# Patient Record
Sex: Female | Born: 1950
Health system: Southern US, Community
[De-identification: ages and names within clinical notes are randomized; demographics above are authoritative.]

## PROBLEM LIST (undated history)

## (undated) DIAGNOSIS — K649 Unspecified hemorrhoids: Secondary | ICD-10-CM

## (undated) DIAGNOSIS — R519 Headache, unspecified: Secondary | ICD-10-CM

## (undated) DIAGNOSIS — E079 Disorder of thyroid, unspecified: Secondary | ICD-10-CM

## (undated) DIAGNOSIS — R51 Headache: Secondary | ICD-10-CM

## (undated) DIAGNOSIS — G8929 Other chronic pain: Secondary | ICD-10-CM

## (undated) HISTORY — DX: Other chronic pain: G89.29

## (undated) HISTORY — DX: Disorder of thyroid, unspecified: E07.9

## (undated) HISTORY — DX: Headache, unspecified: R51.9

## (undated) HISTORY — DX: Unspecified hemorrhoids: K64.9

## (undated) HISTORY — PX: THYROIDECTOMY, PARTIAL: SHX18

## (undated) HISTORY — DX: Headache: R51

---

## 1981-08-30 HISTORY — PX: APPENDECTOMY: SHX54

## 1981-08-30 HISTORY — PX: CHOLECYSTECTOMY: SHX55

## 1997-12-17 ENCOUNTER — Other Ambulatory Visit: Admission: RE | Admit: 1997-12-17 | Discharge: 1997-12-17 | Payer: Self-pay | Admitting: Obstetrics and Gynecology

## 1998-12-10 ENCOUNTER — Other Ambulatory Visit: Admission: RE | Admit: 1998-12-10 | Discharge: 1998-12-10 | Payer: Self-pay | Admitting: Obstetrics and Gynecology

## 1999-08-10 ENCOUNTER — Encounter: Admission: RE | Admit: 1999-08-10 | Discharge: 1999-08-10 | Payer: Self-pay | Admitting: Obstetrics and Gynecology

## 1999-08-10 ENCOUNTER — Encounter: Payer: Self-pay | Admitting: Obstetrics and Gynecology

## 1999-10-08 ENCOUNTER — Ambulatory Visit (HOSPITAL_COMMUNITY): Admission: RE | Admit: 1999-10-08 | Discharge: 1999-10-08 | Payer: Self-pay | Admitting: Gastroenterology

## 2000-01-11 ENCOUNTER — Other Ambulatory Visit: Admission: RE | Admit: 2000-01-11 | Discharge: 2000-01-11 | Payer: Self-pay | Admitting: Obstetrics and Gynecology

## 2000-07-29 ENCOUNTER — Other Ambulatory Visit: Admission: RE | Admit: 2000-07-29 | Discharge: 2000-07-29 | Payer: Self-pay | Admitting: Obstetrics and Gynecology

## 2001-02-09 ENCOUNTER — Other Ambulatory Visit: Admission: RE | Admit: 2001-02-09 | Discharge: 2001-02-09 | Payer: Self-pay | Admitting: Obstetrics and Gynecology

## 2001-08-16 ENCOUNTER — Ambulatory Visit (HOSPITAL_COMMUNITY): Admission: RE | Admit: 2001-08-16 | Discharge: 2001-08-16 | Payer: Self-pay | Admitting: Obstetrics and Gynecology

## 2002-03-12 ENCOUNTER — Other Ambulatory Visit: Admission: RE | Admit: 2002-03-12 | Discharge: 2002-03-12 | Payer: Self-pay | Admitting: Obstetrics and Gynecology

## 2003-04-11 ENCOUNTER — Other Ambulatory Visit: Admission: RE | Admit: 2003-04-11 | Discharge: 2003-04-11 | Payer: Self-pay | Admitting: Obstetrics and Gynecology

## 2004-04-30 ENCOUNTER — Other Ambulatory Visit: Admission: RE | Admit: 2004-04-30 | Discharge: 2004-04-30 | Payer: Self-pay | Admitting: Obstetrics and Gynecology

## 2005-08-12 ENCOUNTER — Other Ambulatory Visit: Admission: RE | Admit: 2005-08-12 | Discharge: 2005-08-12 | Payer: Self-pay | Admitting: Obstetrics and Gynecology

## 2015-04-21 DIAGNOSIS — Z131 Encounter for screening for diabetes mellitus: Secondary | ICD-10-CM | POA: Diagnosis not present

## 2015-04-21 DIAGNOSIS — E780 Pure hypercholesterolemia: Secondary | ICD-10-CM | POA: Diagnosis not present

## 2015-04-21 DIAGNOSIS — Z23 Encounter for immunization: Secondary | ICD-10-CM | POA: Diagnosis not present

## 2015-04-21 DIAGNOSIS — Z136 Encounter for screening for cardiovascular disorders: Secondary | ICD-10-CM | POA: Diagnosis not present

## 2015-04-21 DIAGNOSIS — Z0000 Encounter for general adult medical examination without abnormal findings: Secondary | ICD-10-CM | POA: Diagnosis not present

## 2015-05-06 ENCOUNTER — Encounter: Payer: Self-pay | Admitting: Gastroenterology

## 2015-05-13 ENCOUNTER — Other Ambulatory Visit: Payer: Self-pay | Admitting: Specialist

## 2015-05-13 DIAGNOSIS — R51 Headache: Principal | ICD-10-CM

## 2015-05-13 DIAGNOSIS — G8929 Other chronic pain: Secondary | ICD-10-CM

## 2015-05-24 ENCOUNTER — Ambulatory Visit
Admission: RE | Admit: 2015-05-24 | Discharge: 2015-05-24 | Disposition: A | Discharge status: Home / Self Care | Payer: Medicare Other | Source: Ambulatory Visit | Attending: Specialist | Admitting: Specialist

## 2015-05-24 DIAGNOSIS — R51 Headache: Principal | ICD-10-CM

## 2015-05-24 DIAGNOSIS — R519 Headache, unspecified: Secondary | ICD-10-CM

## 2015-06-24 ENCOUNTER — Ambulatory Visit (INDEPENDENT_AMBULATORY_CARE_PROVIDER_SITE_OTHER): Payer: Medicare Other | Admitting: Gastroenterology

## 2015-06-24 ENCOUNTER — Encounter: Payer: Self-pay | Admitting: Gastroenterology

## 2015-06-24 VITALS — BP 120/70 | HR 70 | Ht 65.5 in | Wt 132.1 lb

## 2015-06-24 DIAGNOSIS — K5900 Constipation, unspecified: Secondary | ICD-10-CM | POA: Diagnosis not present

## 2015-06-24 DIAGNOSIS — K625 Hemorrhage of anus and rectum: Secondary | ICD-10-CM | POA: Diagnosis not present

## 2015-06-24 DIAGNOSIS — K649 Unspecified hemorrhoids: Secondary | ICD-10-CM | POA: Diagnosis not present

## 2015-06-24 MED ORDER — HYDROCORTISONE ACETATE 25 MG RE SUPP: 25.0000 mg | Freq: Every day | RECTAL | Status: DC

## 2015-06-24 NOTE — Progress Notes (Signed)
HPI :  64 y/o female seen in consultation here for rectal bleeding and history of hemorrhoids.   Patient reports about 2 years ago she had rectal bleeding thought to be due to hemorrhoids, and underwent banding per Dr. Thana Farr. She had first two banding procedures which went fine without issues, and then after the 3rd banding she has had persistence of blood in the stools. She has waited for a few years but symptoms have essentially persisted over this time. She reports blood on the toilet paper only and wears pads to prevent bleeding on her clothes. She denies blood in the stools or in the toilet water. She notices her bleeding typically after 5 min following bowel movement, with 90% of her stools. She has some pain and irritation in the perianal area, roughly 40% of the time. She is having chronic constipation, has roughly 2 BMs per week. She was told to take miralax which she takes most days of the week but not daily. She denies significant straining with her stools. No abdominal pains.   She has had 2 prior colonoscopies, the last one was 10 years ago, no report on file. No polyps removed. Her father had colon cancer later in age, diagnosed around age 35s. No weight loss. She is eating okay without postprandial complaints.   She otherwise is healthy aside of PMH as below.   Past Medical History  Diagnosis Date  . Chronic headaches   . Hemorrhoids   . Thyroid disease      Past Surgical History  Procedure Laterality Date  . Appendectomy  1983  . Cholecystectomy  1983  . Thyroidectomy, partial     Family History  Problem Relation Age of Onset  . Colon cancer Father    Social History  Substance Use Topics  . Smoking status: Never Smoker   . Smokeless tobacco: None  . Alcohol Use: 4.2 - 8.4 oz/week    7-14 Standard drinks or equivalent per week   Current Outpatient Prescriptions  Medication Sig Dispense Refill  . gabapentin (NEURONTIN) 300 MG capsule Take 300 mg by mouth at  bedtime.    Marland Kitchen thyroid (ARMOUR) 120 MG tablet Take 120 mg by mouth daily before breakfast.    . hydrocortisone (ANUSOL-HC) 25 MG suppository Place 1 suppository (25 mg total) rectally daily. 20 suppository 1   No current facility-administered medications for this visit.   No Known Allergies   Review of Systems: All systems reviewed and negative except where noted in HPI.    No results found.  Physical Exam: BP 120/70 mmHg  Pulse 70  Ht 5' 5.5" (1.664 m)  Wt 132 lb 2 oz (59.932 kg)  BMI 21.64 kg/m2  LMP 06/24/2015 Constitutional: Pleasant,well-developed, female in no acute distress. HEENT: Normocephalic and atraumatic. Conjunctivae are normal. No scleral icterus. Neck supple.  Cardiovascular: Normal rate, regular rhythm.  Pulmonary/chest: Effort normal and breath sounds normal. No wheezing, rales or rhonchi. Abdominal: Soft, nondistended, nontender. Bowel sounds active throughout. There are no masses palpable. No hepatomegaly. Rectal exam: external hemorrhoid / skin tag, no anal fissure or mass noted on DRE Extremities: no edema Lymphadenopathy: No cervical adenopathy noted. Neurological: Alert and oriented to person place and time. Skin: Skin is warm and dry. No rashes noted. Psychiatric: Normal mood and affect. Behavior is normal.   ASSESSMENT AND PLAN: 64 y/o female with history of hemorrhoids s/p banding, last done 2 years ago, who has persistence of rectal bleeding over time. Father had colon cancer in age  1s, she has not had a colonoscopy in over 10 years, no reported history of polyps but no endoscopy report available today. DRE showed external hemorrhoid / skin tag, no fissure.   While I suspect she more than likely has hemorrhoids causing her symptoms, given her family history and last colonoscopy over 10 years ago, I am recommending a colonoscopy at this time to clarify the source of her bleeding and perform her CRC screening. She strongly did not wish to undergo a  colonoscopy, does not think she can tolerate the bowel prep and that her hemorrhoids will severely bother her with this. She was amenable to a flexible sigmoidoscopy to ensure no other process in the distal rectum and further evaluate internal hemorrhoids. I suspect she will likely need banding of hemorrhoids again or surgical consult pending the findings if this appears to be the culprit. Otherwise, recommend she take miralax daily and titrate up as needed to prevent constipation in the interim, and provided a trial of Anusol to use daily for 1-2 weeks to see if this helps.   She reports she is willing to proceed with a colonoscopy in the future once her acute issues are addressed, but was adamant that she did not want a colonoscopy at this time while understanding my recommendation for it. Will await flex sig results and her course following treatment of constipation and trial of Anusol.   Lauren Cellar, MD [facility] Pager [phone]

## 2015-06-24 NOTE — Patient Instructions (Signed)
You have been scheduled for a flexible sigmoidoscopy. Please follow the written instructions given to you at your visit today. If you use inhalers (even only as needed), please bring them with you on the day of your procedure.   We have sent medications to your pharmacy for you to pick up at your convenience.  Begin taking Miralax daily.

## 2015-08-19 DIAGNOSIS — Z1231 Encounter for screening mammogram for malignant neoplasm of breast: Secondary | ICD-10-CM | POA: Diagnosis not present

## 2015-09-03 DIAGNOSIS — K5900 Constipation, unspecified: Secondary | ICD-10-CM | POA: Diagnosis not present

## 2015-09-03 DIAGNOSIS — Z800 Family history of malignant neoplasm of digestive organs: Secondary | ICD-10-CM | POA: Diagnosis not present

## 2015-09-08 ENCOUNTER — Other Ambulatory Visit: Payer: Medicare Other | Admitting: Gastroenterology

## 2015-09-08 DIAGNOSIS — E039 Hypothyroidism, unspecified: Secondary | ICD-10-CM | POA: Diagnosis not present

## 2015-09-08 DIAGNOSIS — R7301 Impaired fasting glucose: Secondary | ICD-10-CM | POA: Diagnosis not present

## 2015-09-15 DIAGNOSIS — I10 Essential (primary) hypertension: Secondary | ICD-10-CM | POA: Diagnosis not present

## 2015-09-15 DIAGNOSIS — R7301 Impaired fasting glucose: Secondary | ICD-10-CM | POA: Diagnosis not present

## 2015-09-15 DIAGNOSIS — E039 Hypothyroidism, unspecified: Secondary | ICD-10-CM | POA: Diagnosis not present

## 2015-10-31 DIAGNOSIS — M25572 Pain in left ankle and joints of left foot: Secondary | ICD-10-CM | POA: Diagnosis not present

## 2015-10-31 DIAGNOSIS — M25571 Pain in right ankle and joints of right foot: Secondary | ICD-10-CM | POA: Diagnosis not present

## 2015-11-21 DIAGNOSIS — S82871A Displaced pilon fracture of right tibia, initial encounter for closed fracture: Secondary | ICD-10-CM | POA: Diagnosis not present

## 2015-12-19 DIAGNOSIS — S82871D Displaced pilon fracture of right tibia, subsequent encounter for closed fracture with routine healing: Secondary | ICD-10-CM | POA: Diagnosis not present

## 2016-01-08 DIAGNOSIS — M8588 Other specified disorders of bone density and structure, other site: Secondary | ICD-10-CM | POA: Diagnosis not present

## 2016-01-08 DIAGNOSIS — N958 Other specified menopausal and perimenopausal disorders: Secondary | ICD-10-CM | POA: Diagnosis not present

## 2016-01-13 DIAGNOSIS — S82871D Displaced pilon fracture of right tibia, subsequent encounter for closed fracture with routine healing: Secondary | ICD-10-CM | POA: Diagnosis not present

## 2016-06-10 DIAGNOSIS — K642 Third degree hemorrhoids: Secondary | ICD-10-CM | POA: Diagnosis not present

## 2016-06-10 DIAGNOSIS — K648 Other hemorrhoids: Secondary | ICD-10-CM | POA: Diagnosis not present

## 2016-06-10 DIAGNOSIS — K635 Polyp of colon: Secondary | ICD-10-CM | POA: Diagnosis not present

## 2016-06-10 DIAGNOSIS — D125 Benign neoplasm of sigmoid colon: Secondary | ICD-10-CM | POA: Diagnosis not present

## 2016-06-10 DIAGNOSIS — D122 Benign neoplasm of ascending colon: Secondary | ICD-10-CM | POA: Diagnosis not present

## 2016-06-10 DIAGNOSIS — D126 Benign neoplasm of colon, unspecified: Secondary | ICD-10-CM | POA: Diagnosis not present

## 2016-06-10 DIAGNOSIS — Z86010 Personal history of colonic polyps: Secondary | ICD-10-CM | POA: Diagnosis not present

## 2016-06-10 DIAGNOSIS — Z1211 Encounter for screening for malignant neoplasm of colon: Secondary | ICD-10-CM | POA: Diagnosis not present

## 2016-06-10 DIAGNOSIS — Z800 Family history of malignant neoplasm of digestive organs: Secondary | ICD-10-CM | POA: Diagnosis not present

## 2016-06-10 DIAGNOSIS — D123 Benign neoplasm of transverse colon: Secondary | ICD-10-CM | POA: Diagnosis not present

## 2016-06-10 DIAGNOSIS — D127 Benign neoplasm of rectosigmoid junction: Secondary | ICD-10-CM | POA: Diagnosis not present

## 2016-06-15 DIAGNOSIS — Z23 Encounter for immunization: Secondary | ICD-10-CM | POA: Diagnosis not present

## 2016-07-07 DIAGNOSIS — K641 Second degree hemorrhoids: Secondary | ICD-10-CM | POA: Diagnosis not present

## 2016-07-21 DIAGNOSIS — K641 Second degree hemorrhoids: Secondary | ICD-10-CM | POA: Diagnosis not present

## 2016-08-12 DIAGNOSIS — E039 Hypothyroidism, unspecified: Secondary | ICD-10-CM | POA: Diagnosis not present

## 2016-08-12 DIAGNOSIS — Z0000 Encounter for general adult medical examination without abnormal findings: Secondary | ICD-10-CM | POA: Diagnosis not present

## 2016-08-12 DIAGNOSIS — Z131 Encounter for screening for diabetes mellitus: Secondary | ICD-10-CM | POA: Diagnosis not present

## 2016-09-14 DIAGNOSIS — Z1231 Encounter for screening mammogram for malignant neoplasm of breast: Secondary | ICD-10-CM | POA: Diagnosis not present

## 2016-09-14 DIAGNOSIS — Z01419 Encounter for gynecological examination (general) (routine) without abnormal findings: Secondary | ICD-10-CM | POA: Diagnosis not present

## 2016-09-21 DIAGNOSIS — E039 Hypothyroidism, unspecified: Secondary | ICD-10-CM | POA: Diagnosis not present

## 2016-09-21 DIAGNOSIS — R7301 Impaired fasting glucose: Secondary | ICD-10-CM | POA: Diagnosis not present

## 2016-09-21 DIAGNOSIS — Z131 Encounter for screening for diabetes mellitus: Secondary | ICD-10-CM | POA: Diagnosis not present

## 2016-09-23 DIAGNOSIS — R7301 Impaired fasting glucose: Secondary | ICD-10-CM | POA: Diagnosis not present

## 2016-09-23 DIAGNOSIS — I10 Essential (primary) hypertension: Secondary | ICD-10-CM | POA: Diagnosis not present

## 2016-09-23 DIAGNOSIS — E039 Hypothyroidism, unspecified: Secondary | ICD-10-CM | POA: Diagnosis not present

## 2016-11-18 DIAGNOSIS — B999 Unspecified infectious disease: Secondary | ICD-10-CM | POA: Diagnosis not present

## 2016-11-22 DIAGNOSIS — E039 Hypothyroidism, unspecified: Secondary | ICD-10-CM | POA: Diagnosis not present

## 2017-01-10 DIAGNOSIS — H903 Sensorineural hearing loss, bilateral: Secondary | ICD-10-CM | POA: Diagnosis not present

## 2017-01-10 DIAGNOSIS — H9313 Tinnitus, bilateral: Secondary | ICD-10-CM | POA: Diagnosis not present

## 2017-01-12 ENCOUNTER — Other Ambulatory Visit: Payer: Self-pay | Admitting: Otolaryngology

## 2017-01-12 DIAGNOSIS — H9313 Tinnitus, bilateral: Secondary | ICD-10-CM

## 2017-01-12 DIAGNOSIS — H903 Sensorineural hearing loss, bilateral: Secondary | ICD-10-CM

## 2017-01-21 ENCOUNTER — Other Ambulatory Visit: Payer: Medicare Other

## 2017-01-25 ENCOUNTER — Ambulatory Visit
Admission: RE | Admit: 2017-01-25 | Discharge: 2017-01-25 | Disposition: A | Discharge status: Home / Self Care | Payer: Medicare Other | Source: Ambulatory Visit | Attending: Otolaryngology | Admitting: Otolaryngology

## 2017-01-25 DIAGNOSIS — H9313 Tinnitus, bilateral: Secondary | ICD-10-CM

## 2017-01-25 DIAGNOSIS — H93A2 Pulsatile tinnitus, left ear: Secondary | ICD-10-CM | POA: Diagnosis not present

## 2017-01-25 DIAGNOSIS — H903 Sensorineural hearing loss, bilateral: Secondary | ICD-10-CM

## 2017-01-25 MED ORDER — GADOBENATE DIMEGLUMINE 529 MG/ML IV SOLN
12.0000 mL | Freq: Once | INTRAVENOUS | Status: AC | PRN
  Administered 2017-01-25: 12 mL via INTRAVENOUS

## 2017-02-15 ENCOUNTER — Telehealth (HOSPITAL_COMMUNITY): Payer: Self-pay

## 2017-02-15 NOTE — Telephone Encounter (Signed)
Called to schedule consult, left message for pt to return call. AW 

## 2017-02-25 ENCOUNTER — Telehealth (HOSPITAL_COMMUNITY): Payer: Self-pay

## 2017-02-25 NOTE — Telephone Encounter (Signed)
Left message for pt to return call. AW 

## 2017-02-28 ENCOUNTER — Other Ambulatory Visit (HOSPITAL_COMMUNITY): Payer: Self-pay | Admitting: Interventional Radiology

## 2017-02-28 DIAGNOSIS — H9312 Tinnitus, left ear: Secondary | ICD-10-CM

## 2017-03-14 ENCOUNTER — Ambulatory Visit (HOSPITAL_COMMUNITY): Admission: RE | Admit: 2017-03-14 | Payer: Medicare Other | Source: Ambulatory Visit

## 2017-03-15 ENCOUNTER — Ambulatory Visit (HOSPITAL_COMMUNITY)
Admission: RE | Admit: 2017-03-15 | Discharge: 2017-03-15 | Disposition: A | Discharge status: Home / Self Care | Payer: Medicare Other | Source: Ambulatory Visit | Attending: Interventional Radiology | Admitting: Interventional Radiology

## 2017-03-15 DIAGNOSIS — H9312 Tinnitus, left ear: Secondary | ICD-10-CM | POA: Diagnosis not present

## 2017-03-15 HISTORY — PX: IR RADIOLOGIST EVAL & MGMT: IMG5224

## 2017-03-16 ENCOUNTER — Encounter (HOSPITAL_COMMUNITY): Payer: Self-pay | Admitting: Interventional Radiology

## 2017-03-24 ENCOUNTER — Other Ambulatory Visit: Payer: Self-pay | Admitting: Otolaryngology

## 2017-03-24 DIAGNOSIS — H524 Presbyopia: Secondary | ICD-10-CM | POA: Diagnosis not present

## 2017-03-24 DIAGNOSIS — H2513 Age-related nuclear cataract, bilateral: Secondary | ICD-10-CM | POA: Diagnosis not present

## 2017-03-24 DIAGNOSIS — H9313 Tinnitus, bilateral: Secondary | ICD-10-CM

## 2017-03-24 DIAGNOSIS — H5203 Hypermetropia, bilateral: Secondary | ICD-10-CM | POA: Diagnosis not present

## 2017-03-28 ENCOUNTER — Other Ambulatory Visit: Payer: Medicare Other

## 2017-03-29 ENCOUNTER — Ambulatory Visit
Admission: RE | Admit: 2017-03-29 | Discharge: 2017-03-29 | Disposition: A | Discharge status: Home / Self Care | Payer: Medicare Other | Source: Ambulatory Visit | Attending: Otolaryngology | Admitting: Otolaryngology

## 2017-03-29 DIAGNOSIS — I6521 Occlusion and stenosis of right carotid artery: Secondary | ICD-10-CM | POA: Diagnosis not present

## 2017-03-29 DIAGNOSIS — H9313 Tinnitus, bilateral: Secondary | ICD-10-CM

## 2017-05-09 DIAGNOSIS — Z23 Encounter for immunization: Secondary | ICD-10-CM | POA: Diagnosis not present

## 2017-05-19 DIAGNOSIS — R05 Cough: Secondary | ICD-10-CM | POA: Diagnosis not present

## 2017-05-19 DIAGNOSIS — R0982 Postnasal drip: Secondary | ICD-10-CM | POA: Diagnosis not present

## 2017-08-10 DIAGNOSIS — G4700 Insomnia, unspecified: Secondary | ICD-10-CM | POA: Diagnosis not present

## 2017-08-10 DIAGNOSIS — E7800 Pure hypercholesterolemia, unspecified: Secondary | ICD-10-CM | POA: Diagnosis not present

## 2017-09-15 DIAGNOSIS — Z124 Encounter for screening for malignant neoplasm of cervix: Secondary | ICD-10-CM | POA: Diagnosis not present

## 2017-09-15 DIAGNOSIS — Z6821 Body mass index (BMI) 21.0-21.9, adult: Secondary | ICD-10-CM | POA: Diagnosis not present

## 2017-09-20 DIAGNOSIS — R7301 Impaired fasting glucose: Secondary | ICD-10-CM | POA: Diagnosis not present

## 2017-09-20 DIAGNOSIS — E039 Hypothyroidism, unspecified: Secondary | ICD-10-CM | POA: Diagnosis not present

## 2017-09-26 DIAGNOSIS — E039 Hypothyroidism, unspecified: Secondary | ICD-10-CM | POA: Diagnosis not present

## 2017-09-26 DIAGNOSIS — I10 Essential (primary) hypertension: Secondary | ICD-10-CM | POA: Diagnosis not present

## 2017-09-26 DIAGNOSIS — R7301 Impaired fasting glucose: Secondary | ICD-10-CM | POA: Diagnosis not present

## 2017-10-20 DIAGNOSIS — D485 Neoplasm of uncertain behavior of skin: Secondary | ICD-10-CM | POA: Diagnosis not present

## 2017-10-20 DIAGNOSIS — D2272 Melanocytic nevi of left lower limb, including hip: Secondary | ICD-10-CM | POA: Diagnosis not present

## 2017-10-20 DIAGNOSIS — D225 Melanocytic nevi of trunk: Secondary | ICD-10-CM | POA: Diagnosis not present

## 2017-10-20 DIAGNOSIS — D2262 Melanocytic nevi of left upper limb, including shoulder: Secondary | ICD-10-CM | POA: Diagnosis not present

## 2017-10-20 DIAGNOSIS — D2261 Melanocytic nevi of right upper limb, including shoulder: Secondary | ICD-10-CM | POA: Diagnosis not present

## 2017-10-20 DIAGNOSIS — D1801 Hemangioma of skin and subcutaneous tissue: Secondary | ICD-10-CM | POA: Diagnosis not present

## 2017-10-20 DIAGNOSIS — D2372 Other benign neoplasm of skin of left lower limb, including hip: Secondary | ICD-10-CM | POA: Diagnosis not present

## 2017-10-20 DIAGNOSIS — D2371 Other benign neoplasm of skin of right lower limb, including hip: Secondary | ICD-10-CM | POA: Diagnosis not present

## 2017-10-20 DIAGNOSIS — L920 Granuloma annulare: Secondary | ICD-10-CM | POA: Diagnosis not present

## 2017-10-20 DIAGNOSIS — D2271 Melanocytic nevi of right lower limb, including hip: Secondary | ICD-10-CM | POA: Diagnosis not present

## 2017-10-20 DIAGNOSIS — L814 Other melanin hyperpigmentation: Secondary | ICD-10-CM | POA: Diagnosis not present

## 2017-10-20 DIAGNOSIS — L821 Other seborrheic keratosis: Secondary | ICD-10-CM | POA: Diagnosis not present

## 2017-11-14 DIAGNOSIS — Z0000 Encounter for general adult medical examination without abnormal findings: Secondary | ICD-10-CM | POA: Diagnosis not present

## 2017-11-14 DIAGNOSIS — Z23 Encounter for immunization: Secondary | ICD-10-CM | POA: Diagnosis not present

## 2017-11-14 DIAGNOSIS — E039 Hypothyroidism, unspecified: Secondary | ICD-10-CM | POA: Diagnosis not present

## 2017-11-14 DIAGNOSIS — E7800 Pure hypercholesterolemia, unspecified: Secondary | ICD-10-CM | POA: Diagnosis not present

## 2017-11-17 DIAGNOSIS — E7800 Pure hypercholesterolemia, unspecified: Secondary | ICD-10-CM | POA: Diagnosis not present

## 2017-11-17 DIAGNOSIS — Z0000 Encounter for general adult medical examination without abnormal findings: Secondary | ICD-10-CM | POA: Diagnosis not present

## 2017-11-17 DIAGNOSIS — G4700 Insomnia, unspecified: Secondary | ICD-10-CM | POA: Diagnosis not present

## 2017-11-17 DIAGNOSIS — Z1159 Encounter for screening for other viral diseases: Secondary | ICD-10-CM | POA: Diagnosis not present

## 2018-01-09 DIAGNOSIS — Z1231 Encounter for screening mammogram for malignant neoplasm of breast: Secondary | ICD-10-CM | POA: Diagnosis not present

## 2018-01-09 DIAGNOSIS — M8588 Other specified disorders of bone density and structure, other site: Secondary | ICD-10-CM | POA: Diagnosis not present

## 2018-01-09 DIAGNOSIS — N958 Other specified menopausal and perimenopausal disorders: Secondary | ICD-10-CM | POA: Diagnosis not present

## 2018-01-10 ENCOUNTER — Other Ambulatory Visit: Payer: Self-pay | Admitting: Family Medicine

## 2018-01-10 ENCOUNTER — Ambulatory Visit
Admission: RE | Admit: 2018-01-10 | Discharge: 2018-01-10 | Disposition: A | Discharge status: Home / Self Care | Payer: Medicare Other | Source: Ambulatory Visit | Attending: Family Medicine | Admitting: Family Medicine

## 2018-01-10 DIAGNOSIS — R42 Dizziness and giddiness: Secondary | ICD-10-CM | POA: Diagnosis not present

## 2018-01-10 DIAGNOSIS — R51 Headache: Secondary | ICD-10-CM | POA: Diagnosis not present

## 2018-01-10 DIAGNOSIS — H9313 Tinnitus, bilateral: Secondary | ICD-10-CM

## 2018-01-10 DIAGNOSIS — R531 Weakness: Secondary | ICD-10-CM | POA: Diagnosis not present

## 2018-01-12 DIAGNOSIS — K120 Recurrent oral aphthae: Secondary | ICD-10-CM | POA: Diagnosis not present

## 2018-05-31 DIAGNOSIS — Z23 Encounter for immunization: Secondary | ICD-10-CM | POA: Diagnosis not present

## 2018-06-14 DIAGNOSIS — R635 Abnormal weight gain: Secondary | ICD-10-CM | POA: Diagnosis not present

## 2018-06-14 DIAGNOSIS — N951 Menopausal and female climacteric states: Secondary | ICD-10-CM | POA: Diagnosis not present

## 2018-06-19 DIAGNOSIS — F329 Major depressive disorder, single episode, unspecified: Secondary | ICD-10-CM | POA: Diagnosis not present

## 2018-06-19 DIAGNOSIS — N951 Menopausal and female climacteric states: Secondary | ICD-10-CM | POA: Diagnosis not present

## 2018-06-19 DIAGNOSIS — M26629 Arthralgia of temporomandibular joint, unspecified side: Secondary | ICD-10-CM | POA: Diagnosis not present

## 2018-06-19 DIAGNOSIS — R6882 Decreased libido: Secondary | ICD-10-CM | POA: Diagnosis not present

## 2018-06-19 DIAGNOSIS — G479 Sleep disorder, unspecified: Secondary | ICD-10-CM | POA: Diagnosis not present

## 2018-06-19 DIAGNOSIS — E039 Hypothyroidism, unspecified: Secondary | ICD-10-CM | POA: Diagnosis not present

## 2018-07-06 IMAGING — MR MR MRA HEAD W/O CM
13 of 15 series · 36 of 48 positions shown · IV contrast (multihance)
Comparison: 05/24/2015

CLINICAL DATA: Chronic left-sided pulsatile tinnitus. Slight
worsening recently.

Creatinine was obtained on site at [HOSPITAL] at [HOSPITAL].Results: Creatinine 0.8 mg/dL.
EXAM:
MRI HEAD WITHOUT AND WITH CONTRAST
MRA HEAD WITHOUT CONTRAST
TECHNIQUE: Multiplanar, multiecho pulse sequences of the brain and surrounding
structures were obtained without and with intravenous contrast.
Angiographic images of the head were obtained using MRA technique
without contrast.
CONTRAST:  12mL MULTIHANCE GADOBENATE DIMEGLUMINE 529 MG/ML IV SOLN

[Series 2: T1 · sagittal · 5.0mm · 0.45mm/px · 1 of 19 slices shown (1 of 3)]
[im 1/19]
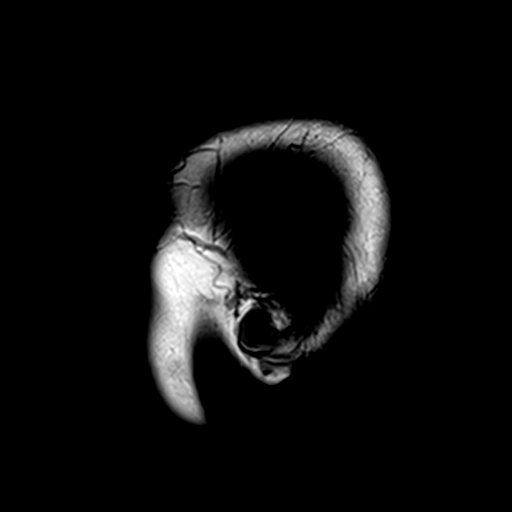

[Series 4: DWI · axial · 3.0mm · 1.80mm/px · z∈[-38,+108]mm · 5 of 99 slices shown (1 of 2)]
[im 1/99]
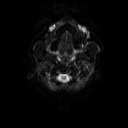
[im 25/99]
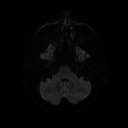
[im 50/99]
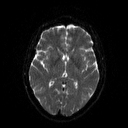
[im 74/99]
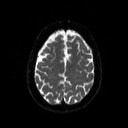
[im 99/99]
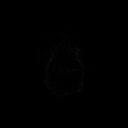

[Series 5: DWI · axial · 3.0mm · 1.80mm/px · z∈[-38,+108]mm · 3 of 50 slices shown (2 of 2)]
[im 1/50]
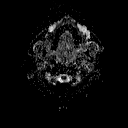
[im 25/50]
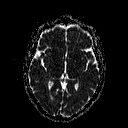
[im 50/50]
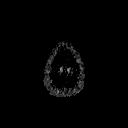

[Series 6: tof_3d_multi-slab · axial · 0.7mm · 0.35mm/px · z∈[-21,+62]mm · 8 of 126 slices shown]
[im 1/126]
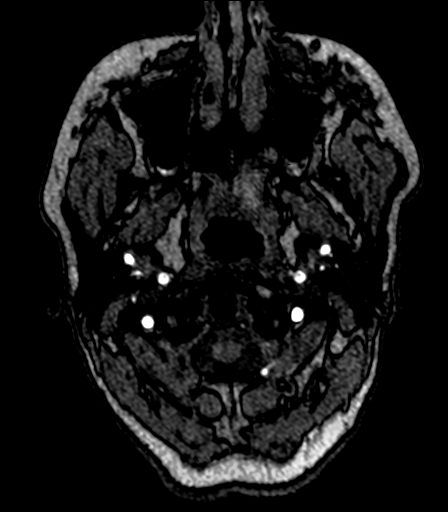
[im 18/126]
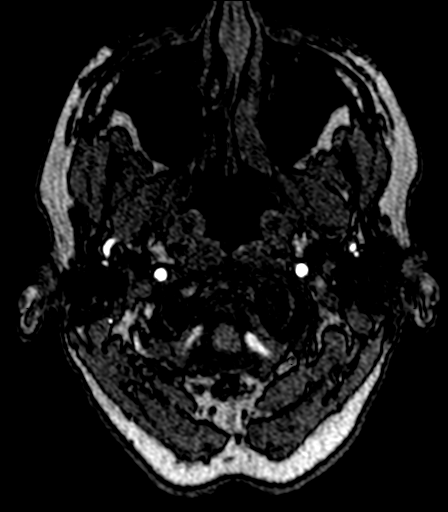
[im 36/126]
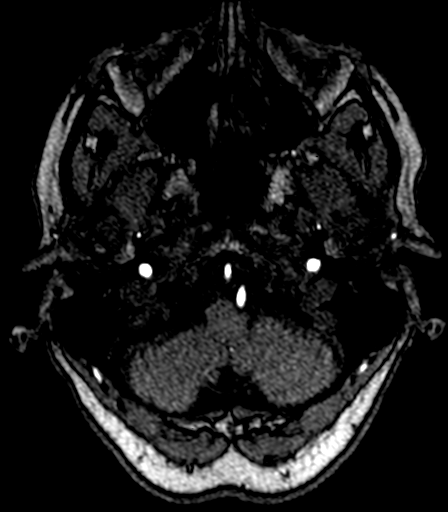
[im 54/126]
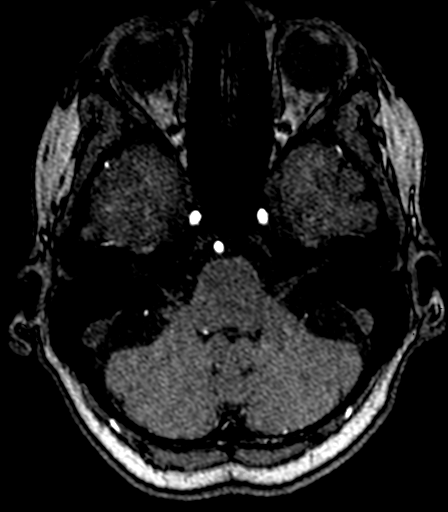
[im 72/126]
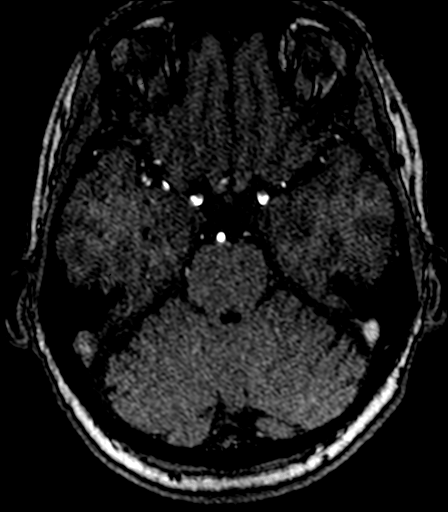
[im 90/126]
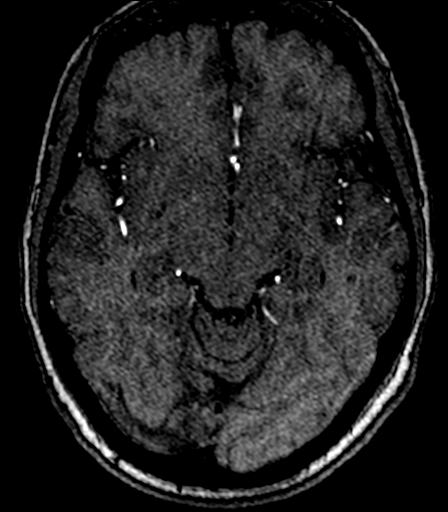
[im 108/126]
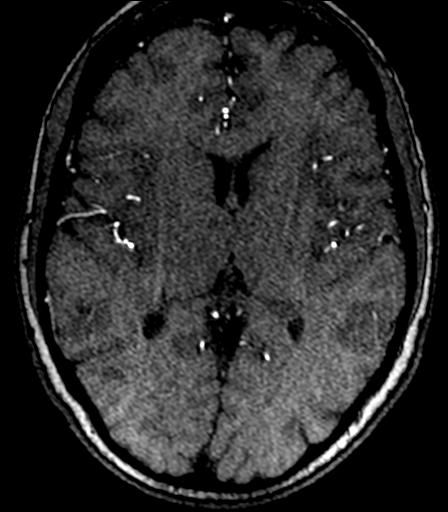
[im 126/126]
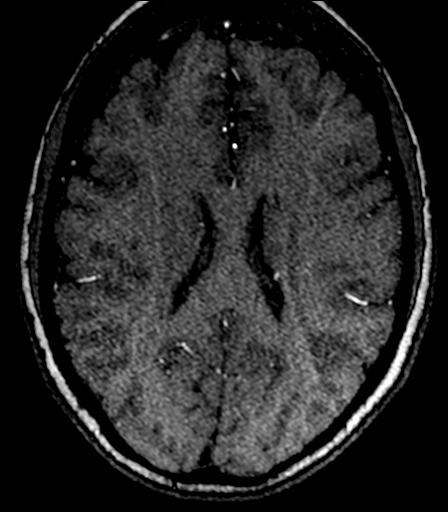

[Series 10: T2 · axial · 5.0mm · 0.45mm/px · 1 of 23 slices shown (1 of 2)]
[im 1/23]
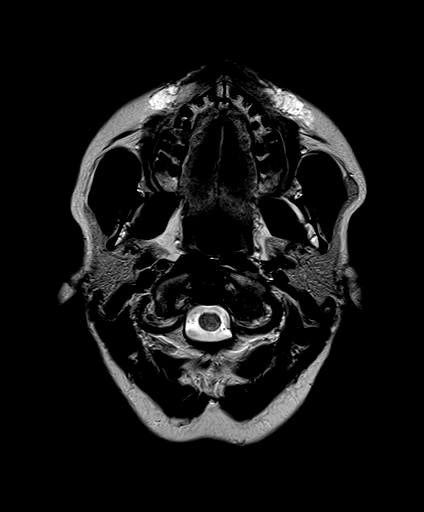

[Series 12: swi_images · axial · 2.0mm · 0.90mm/px · z∈[-36,+105]mm · 4 of 72 slices shown]
[im 1/72]
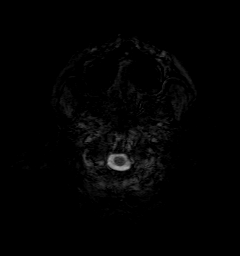
[im 24/72]
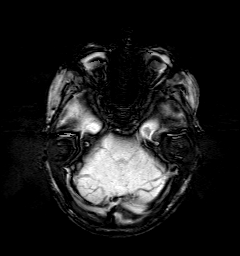
[im 48/72]
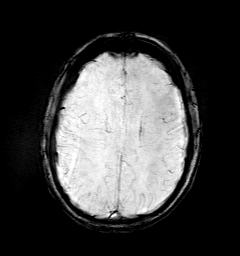
[im 72/72]
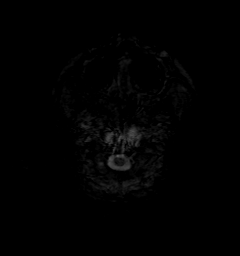

[Series 13: FLAIR · axial · 3.0mm · 0.45mm/px · 1 of 24 slices shown]
[im 1/24]
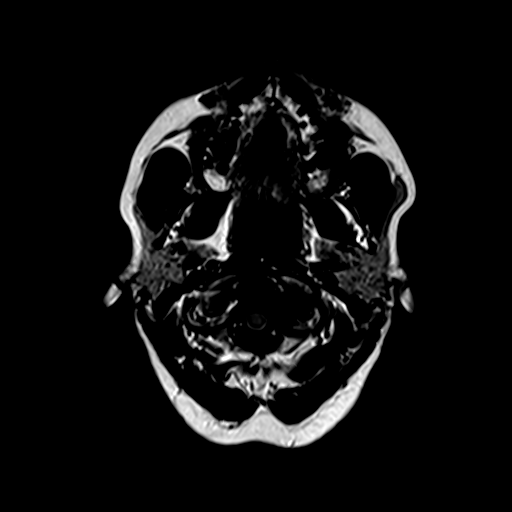

[Series 14: t1_mpr_tra · axial · 1.0mm · 0.72mm/px · z∈[-37,+105]mm · 8 of 144 slices shown]
[im 1/144]
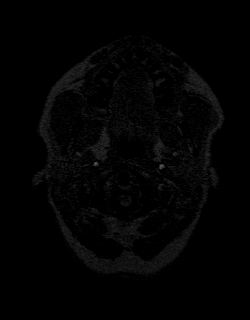
[im 18/144]
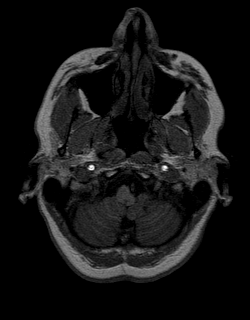
[im 36/144]
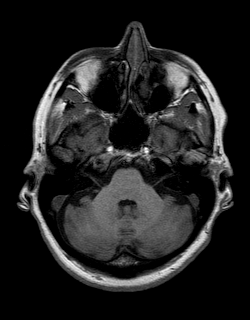
[im 54/144]
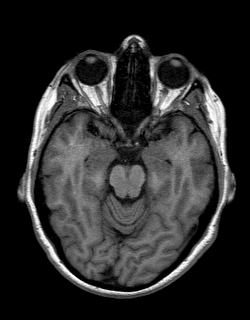
[im 90/144]
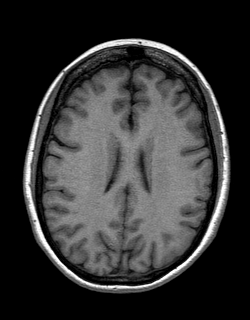
[im 108/144]
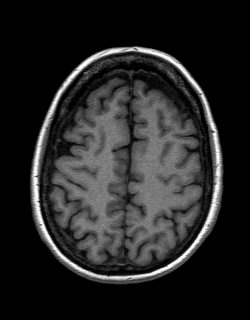
[im 126/144]
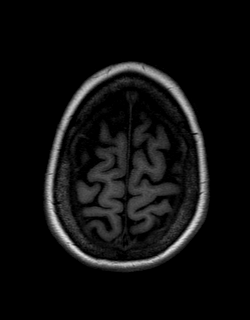
[im 144/144]
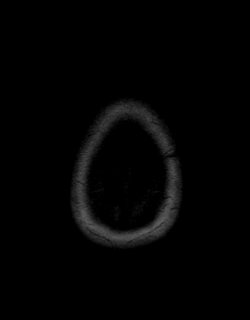

[Series 15: T1 · coronal · 3.0mm · 0.35mm/px · 1 of 11 slices shown (2 of 3)]
[im 1/11]
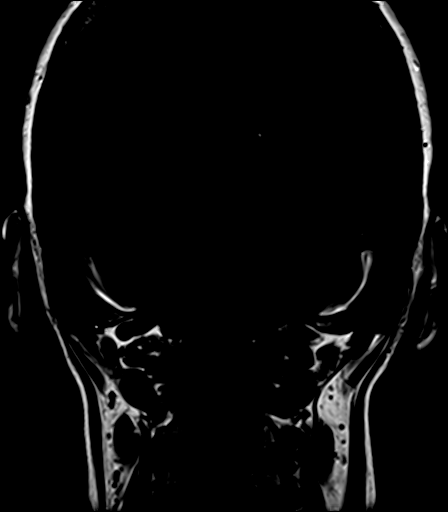

[Series 16: T1 · axial · 3.0mm · 0.35mm/px · 1 of 11 slices shown (3 of 3)]
[im 1/11]
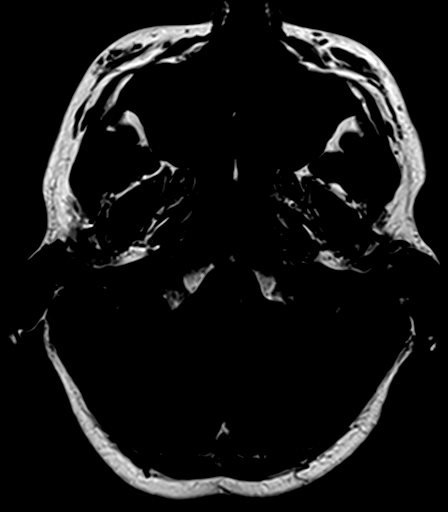

[Series 18: T2 · coronal · 5.0mm · 0.45mm/px · 1 of 25 slices shown (2 of 2)]
[im 1/25]
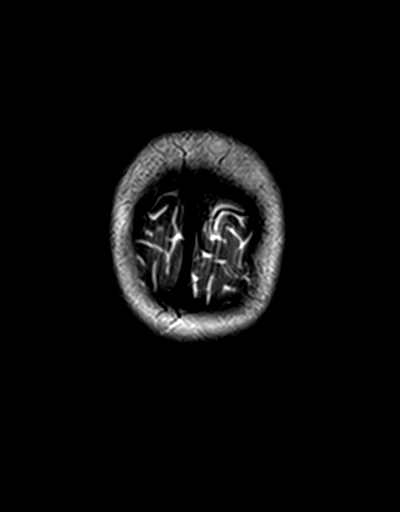

[Series 22: T1 post-contrast · coronal · 3.0mm · 0.35mm/px · 1 of 11 slices shown (1 of 2)]
[im 1/11]
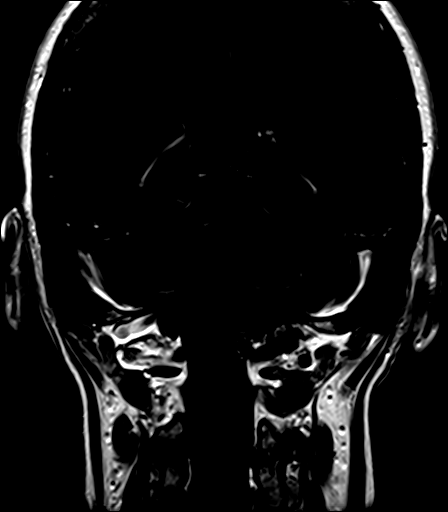

[Series 25: T1 post-contrast · axial · 3.0mm · 0.35mm/px · 1 of 11 slices shown (2 of 2)]
[im 1/11]
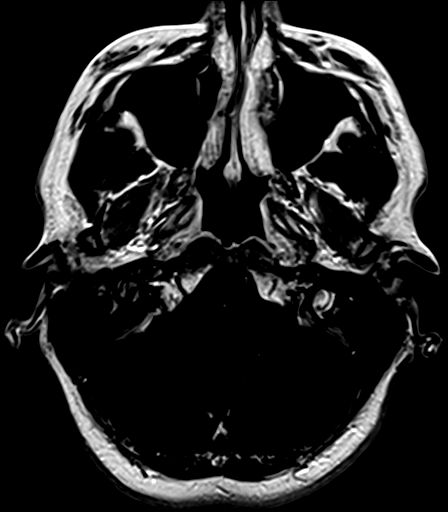

[36 of 48 positions shown; findings below may reference images not displayed]

FINDINGS: MRI HEAD FINDINGS

Brain: There is no evidence of accelerated atrophy, recent or old
small or large vessel infarction. As seen previously, there are 2
cavernous angiomas, 1 within the inferior cerebellum on the left
measuring 5-6 mm in size an the other in the left temporal lobe
measuring about 3 mm in size. These have not changed and although
there is hemosiderin deposition, the appearance is the same and
there is no evidence of edema to suggest any recent hemorrhage or
thrombosis. These are quite likely longstanding incidental and
insignificant abnormalities. Slow flow cavernomas are not known to
be associated with pulsatile tinnitus.

No neoplastic mass lesion, no hydrocephalus or extra-axial
collection.

CP angle regions themselves appear normal. Seventh and eighth nerve
complexes are normal. No evidence of vestibular schwannoma or
enhancing neuritis. No abnormal vessels identified before or after
contrast. Venous sinuses are patent, symmetric and normal.

Vascular: Normal arterial and venous structures external to the
brain.

Skull and upper cervical spine: Normal

Sinuses/Orbits: Clear/normal

Other: None

MRA HEAD FINDINGS

Both internal carotid arteries are widely patent through the
skullbase and siphon regions. The anterior and middle cerebral
vessels are normal without proximal stenosis, aneurysm or vascular
malformation.

Both vertebral arteries are widely patent to the basilar. No basilar
stenosis. Posterior circulation branch vessels are normal.

No evidence of dural AV fistula.
IMPRESSION: No abnormality seen to explain pulsatile tinnitus.

No change since the study of 05/24/2015. Cavernous angiomas of the
inferior left cerebellum and left temporal lobe, stable in
appearance and unlikely to be of any clinical significance.

## 2018-07-20 DIAGNOSIS — N951 Menopausal and female climacteric states: Secondary | ICD-10-CM | POA: Diagnosis not present

## 2018-07-20 DIAGNOSIS — G479 Sleep disorder, unspecified: Secondary | ICD-10-CM | POA: Diagnosis not present

## 2018-07-20 DIAGNOSIS — E039 Hypothyroidism, unspecified: Secondary | ICD-10-CM | POA: Diagnosis not present

## 2018-07-20 DIAGNOSIS — M26629 Arthralgia of temporomandibular joint, unspecified side: Secondary | ICD-10-CM | POA: Diagnosis not present

## 2018-07-20 DIAGNOSIS — R6882 Decreased libido: Secondary | ICD-10-CM | POA: Diagnosis not present

## 2018-07-20 DIAGNOSIS — F329 Major depressive disorder, single episode, unspecified: Secondary | ICD-10-CM | POA: Diagnosis not present

## 2018-07-24 DIAGNOSIS — G479 Sleep disorder, unspecified: Secondary | ICD-10-CM | POA: Diagnosis not present

## 2018-07-24 DIAGNOSIS — N951 Menopausal and female climacteric states: Secondary | ICD-10-CM | POA: Diagnosis not present

## 2018-07-24 DIAGNOSIS — E039 Hypothyroidism, unspecified: Secondary | ICD-10-CM | POA: Diagnosis not present

## 2018-07-24 DIAGNOSIS — R4586 Emotional lability: Secondary | ICD-10-CM | POA: Diagnosis not present

## 2018-09-18 DIAGNOSIS — Z01419 Encounter for gynecological examination (general) (routine) without abnormal findings: Secondary | ICD-10-CM | POA: Diagnosis not present

## 2018-09-18 DIAGNOSIS — Z6822 Body mass index (BMI) 22.0-22.9, adult: Secondary | ICD-10-CM | POA: Diagnosis not present

## 2018-09-19 DIAGNOSIS — R7301 Impaired fasting glucose: Secondary | ICD-10-CM | POA: Diagnosis not present

## 2018-09-19 DIAGNOSIS — E039 Hypothyroidism, unspecified: Secondary | ICD-10-CM | POA: Diagnosis not present

## 2018-09-26 DIAGNOSIS — E039 Hypothyroidism, unspecified: Secondary | ICD-10-CM | POA: Diagnosis not present

## 2018-09-26 DIAGNOSIS — R7301 Impaired fasting glucose: Secondary | ICD-10-CM | POA: Diagnosis not present

## 2018-09-26 DIAGNOSIS — I10 Essential (primary) hypertension: Secondary | ICD-10-CM | POA: Diagnosis not present

## 2018-09-26 DIAGNOSIS — E890 Postprocedural hypothyroidism: Secondary | ICD-10-CM | POA: Diagnosis not present

## 2018-10-24 DIAGNOSIS — R4586 Emotional lability: Secondary | ICD-10-CM | POA: Diagnosis not present

## 2018-10-24 DIAGNOSIS — G479 Sleep disorder, unspecified: Secondary | ICD-10-CM | POA: Diagnosis not present

## 2018-10-24 DIAGNOSIS — N951 Menopausal and female climacteric states: Secondary | ICD-10-CM | POA: Diagnosis not present

## 2018-10-24 DIAGNOSIS — E039 Hypothyroidism, unspecified: Secondary | ICD-10-CM | POA: Diagnosis not present

## 2018-10-30 DIAGNOSIS — G479 Sleep disorder, unspecified: Secondary | ICD-10-CM | POA: Diagnosis not present

## 2018-10-30 DIAGNOSIS — R6882 Decreased libido: Secondary | ICD-10-CM | POA: Diagnosis not present

## 2018-10-30 DIAGNOSIS — E039 Hypothyroidism, unspecified: Secondary | ICD-10-CM | POA: Diagnosis not present

## 2018-10-30 DIAGNOSIS — N951 Menopausal and female climacteric states: Secondary | ICD-10-CM | POA: Diagnosis not present

## 2018-12-21 DIAGNOSIS — Z0000 Encounter for general adult medical examination without abnormal findings: Secondary | ICD-10-CM | POA: Diagnosis not present

## 2018-12-21 DIAGNOSIS — G4700 Insomnia, unspecified: Secondary | ICD-10-CM | POA: Diagnosis not present

## 2018-12-21 DIAGNOSIS — R002 Palpitations: Secondary | ICD-10-CM | POA: Diagnosis not present

## 2018-12-21 DIAGNOSIS — E039 Hypothyroidism, unspecified: Secondary | ICD-10-CM | POA: Diagnosis not present

## 2019-01-15 DIAGNOSIS — Z1231 Encounter for screening mammogram for malignant neoplasm of breast: Secondary | ICD-10-CM | POA: Diagnosis not present

## 2019-01-23 DIAGNOSIS — L918 Other hypertrophic disorders of the skin: Secondary | ICD-10-CM | POA: Diagnosis not present

## 2019-01-23 DIAGNOSIS — D2371 Other benign neoplasm of skin of right lower limb, including hip: Secondary | ICD-10-CM | POA: Diagnosis not present

## 2019-01-23 DIAGNOSIS — L821 Other seborrheic keratosis: Secondary | ICD-10-CM | POA: Diagnosis not present

## 2019-01-23 DIAGNOSIS — D1801 Hemangioma of skin and subcutaneous tissue: Secondary | ICD-10-CM | POA: Diagnosis not present

## 2019-01-23 DIAGNOSIS — D225 Melanocytic nevi of trunk: Secondary | ICD-10-CM | POA: Diagnosis not present

## 2019-01-23 DIAGNOSIS — D2262 Melanocytic nevi of left upper limb, including shoulder: Secondary | ICD-10-CM | POA: Diagnosis not present

## 2019-01-23 DIAGNOSIS — D2261 Melanocytic nevi of right upper limb, including shoulder: Secondary | ICD-10-CM | POA: Diagnosis not present

## 2019-01-23 DIAGNOSIS — D2271 Melanocytic nevi of right lower limb, including hip: Secondary | ICD-10-CM | POA: Diagnosis not present

## 2019-01-23 DIAGNOSIS — L819 Disorder of pigmentation, unspecified: Secondary | ICD-10-CM | POA: Diagnosis not present

## 2019-01-29 DIAGNOSIS — R6882 Decreased libido: Secondary | ICD-10-CM | POA: Diagnosis not present

## 2019-01-29 DIAGNOSIS — N951 Menopausal and female climacteric states: Secondary | ICD-10-CM | POA: Diagnosis not present

## 2019-01-29 DIAGNOSIS — G479 Sleep disorder, unspecified: Secondary | ICD-10-CM | POA: Diagnosis not present

## 2019-01-29 DIAGNOSIS — E039 Hypothyroidism, unspecified: Secondary | ICD-10-CM | POA: Diagnosis not present

## 2019-01-31 DIAGNOSIS — M26629 Arthralgia of temporomandibular joint, unspecified side: Secondary | ICD-10-CM | POA: Diagnosis not present

## 2019-01-31 DIAGNOSIS — E039 Hypothyroidism, unspecified: Secondary | ICD-10-CM | POA: Diagnosis not present

## 2019-01-31 DIAGNOSIS — N951 Menopausal and female climacteric states: Secondary | ICD-10-CM | POA: Diagnosis not present

## 2019-02-21 DIAGNOSIS — G479 Sleep disorder, unspecified: Secondary | ICD-10-CM | POA: Diagnosis not present

## 2019-02-21 DIAGNOSIS — N951 Menopausal and female climacteric states: Secondary | ICD-10-CM | POA: Diagnosis not present

## 2019-02-21 DIAGNOSIS — R6882 Decreased libido: Secondary | ICD-10-CM | POA: Diagnosis not present

## 2019-05-23 DIAGNOSIS — Z23 Encounter for immunization: Secondary | ICD-10-CM | POA: Diagnosis not present

## 2019-06-04 DIAGNOSIS — N951 Menopausal and female climacteric states: Secondary | ICD-10-CM | POA: Diagnosis not present

## 2019-06-04 DIAGNOSIS — G479 Sleep disorder, unspecified: Secondary | ICD-10-CM | POA: Diagnosis not present

## 2019-06-04 DIAGNOSIS — R6882 Decreased libido: Secondary | ICD-10-CM | POA: Diagnosis not present

## 2019-06-06 DIAGNOSIS — F329 Major depressive disorder, single episode, unspecified: Secondary | ICD-10-CM | POA: Diagnosis not present

## 2019-06-06 DIAGNOSIS — N951 Menopausal and female climacteric states: Secondary | ICD-10-CM | POA: Diagnosis not present

## 2019-06-06 DIAGNOSIS — G479 Sleep disorder, unspecified: Secondary | ICD-10-CM | POA: Diagnosis not present

## 2019-06-06 DIAGNOSIS — R6882 Decreased libido: Secondary | ICD-10-CM | POA: Diagnosis not present

## 2019-10-22 ENCOUNTER — Ambulatory Visit: Payer: Medicare Other

## 2020-09-01 DIAGNOSIS — N898 Other specified noninflammatory disorders of vagina: Secondary | ICD-10-CM | POA: Diagnosis not present

## 2020-09-01 DIAGNOSIS — Z6822 Body mass index (BMI) 22.0-22.9, adult: Secondary | ICD-10-CM | POA: Diagnosis not present

## 2020-09-01 DIAGNOSIS — R4586 Emotional lability: Secondary | ICD-10-CM | POA: Diagnosis not present

## 2020-09-01 DIAGNOSIS — N951 Menopausal and female climacteric states: Secondary | ICD-10-CM | POA: Diagnosis not present

## 2020-09-01 DIAGNOSIS — F329 Major depressive disorder, single episode, unspecified: Secondary | ICD-10-CM | POA: Diagnosis not present

## 2020-09-01 DIAGNOSIS — G479 Sleep disorder, unspecified: Secondary | ICD-10-CM | POA: Diagnosis not present

## 2020-09-01 DIAGNOSIS — R6882 Decreased libido: Secondary | ICD-10-CM | POA: Diagnosis not present

## 2020-09-01 DIAGNOSIS — E039 Hypothyroidism, unspecified: Secondary | ICD-10-CM | POA: Diagnosis not present

## 2020-09-18 DIAGNOSIS — R7301 Impaired fasting glucose: Secondary | ICD-10-CM | POA: Diagnosis not present

## 2020-09-18 DIAGNOSIS — E039 Hypothyroidism, unspecified: Secondary | ICD-10-CM | POA: Diagnosis not present

## 2020-09-25 DIAGNOSIS — E039 Hypothyroidism, unspecified: Secondary | ICD-10-CM | POA: Diagnosis not present

## 2020-09-25 DIAGNOSIS — R7301 Impaired fasting glucose: Secondary | ICD-10-CM | POA: Diagnosis not present

## 2020-09-25 DIAGNOSIS — E890 Postprocedural hypothyroidism: Secondary | ICD-10-CM | POA: Diagnosis not present

## 2020-09-25 DIAGNOSIS — I10 Essential (primary) hypertension: Secondary | ICD-10-CM | POA: Diagnosis not present

## 2020-09-29 DIAGNOSIS — E039 Hypothyroidism, unspecified: Secondary | ICD-10-CM | POA: Diagnosis not present

## 2020-09-29 DIAGNOSIS — N951 Menopausal and female climacteric states: Secondary | ICD-10-CM | POA: Diagnosis not present

## 2020-10-01 DIAGNOSIS — R6882 Decreased libido: Secondary | ICD-10-CM | POA: Diagnosis not present

## 2020-10-01 DIAGNOSIS — R4586 Emotional lability: Secondary | ICD-10-CM | POA: Diagnosis not present

## 2020-10-01 DIAGNOSIS — N898 Other specified noninflammatory disorders of vagina: Secondary | ICD-10-CM | POA: Diagnosis not present

## 2020-10-01 DIAGNOSIS — Z6822 Body mass index (BMI) 22.0-22.9, adult: Secondary | ICD-10-CM | POA: Diagnosis not present

## 2020-10-01 DIAGNOSIS — N951 Menopausal and female climacteric states: Secondary | ICD-10-CM | POA: Diagnosis not present

## 2020-12-30 DIAGNOSIS — R739 Hyperglycemia, unspecified: Secondary | ICD-10-CM | POA: Diagnosis not present

## 2020-12-30 DIAGNOSIS — E559 Vitamin D deficiency, unspecified: Secondary | ICD-10-CM | POA: Diagnosis not present

## 2020-12-30 DIAGNOSIS — E785 Hyperlipidemia, unspecified: Secondary | ICD-10-CM | POA: Diagnosis not present

## 2021-01-06 DIAGNOSIS — E559 Vitamin D deficiency, unspecified: Secondary | ICD-10-CM | POA: Diagnosis not present

## 2021-01-06 DIAGNOSIS — Z0000 Encounter for general adult medical examination without abnormal findings: Secondary | ICD-10-CM | POA: Diagnosis not present

## 2021-01-06 DIAGNOSIS — M8588 Other specified disorders of bone density and structure, other site: Secondary | ICD-10-CM | POA: Diagnosis not present

## 2021-01-06 DIAGNOSIS — E890 Postprocedural hypothyroidism: Secondary | ICD-10-CM | POA: Diagnosis not present

## 2021-01-06 DIAGNOSIS — Z79890 Hormone replacement therapy (postmenopausal): Secondary | ICD-10-CM | POA: Diagnosis not present

## 2021-01-06 DIAGNOSIS — E785 Hyperlipidemia, unspecified: Secondary | ICD-10-CM | POA: Diagnosis not present

## 2021-01-06 DIAGNOSIS — Z1389 Encounter for screening for other disorder: Secondary | ICD-10-CM | POA: Diagnosis not present

## 2021-01-06 DIAGNOSIS — Z1331 Encounter for screening for depression: Secondary | ICD-10-CM | POA: Diagnosis not present

## 2021-01-06 DIAGNOSIS — Z86010 Personal history of colonic polyps: Secondary | ICD-10-CM | POA: Diagnosis not present

## 2021-01-19 DIAGNOSIS — E2839 Other primary ovarian failure: Secondary | ICD-10-CM | POA: Diagnosis not present

## 2021-01-19 DIAGNOSIS — E279 Disorder of adrenal gland, unspecified: Secondary | ICD-10-CM | POA: Diagnosis not present

## 2021-03-24 DIAGNOSIS — Z01419 Encounter for gynecological examination (general) (routine) without abnormal findings: Secondary | ICD-10-CM | POA: Diagnosis not present

## 2021-03-24 DIAGNOSIS — Z6822 Body mass index (BMI) 22.0-22.9, adult: Secondary | ICD-10-CM | POA: Diagnosis not present

## 2021-03-24 DIAGNOSIS — Z1231 Encounter for screening mammogram for malignant neoplasm of breast: Secondary | ICD-10-CM | POA: Diagnosis not present

## 2021-05-11 DIAGNOSIS — E279 Disorder of adrenal gland, unspecified: Secondary | ICD-10-CM | POA: Diagnosis not present

## 2021-05-11 DIAGNOSIS — E282 Polycystic ovarian syndrome: Secondary | ICD-10-CM | POA: Diagnosis not present

## 2021-06-20 DIAGNOSIS — Z23 Encounter for immunization: Secondary | ICD-10-CM | POA: Diagnosis not present

## 2021-07-07 DIAGNOSIS — E282 Polycystic ovarian syndrome: Secondary | ICD-10-CM | POA: Diagnosis not present

## 2021-07-07 DIAGNOSIS — E2839 Other primary ovarian failure: Secondary | ICD-10-CM | POA: Diagnosis not present

## 2021-07-07 DIAGNOSIS — L639 Alopecia areata, unspecified: Secondary | ICD-10-CM | POA: Diagnosis not present

## 2021-07-07 DIAGNOSIS — E279 Disorder of adrenal gland, unspecified: Secondary | ICD-10-CM | POA: Diagnosis not present

## 2021-07-09 DIAGNOSIS — D2261 Melanocytic nevi of right upper limb, including shoulder: Secondary | ICD-10-CM | POA: Diagnosis not present

## 2021-07-09 DIAGNOSIS — D225 Melanocytic nevi of trunk: Secondary | ICD-10-CM | POA: Diagnosis not present

## 2021-07-09 DIAGNOSIS — D2371 Other benign neoplasm of skin of right lower limb, including hip: Secondary | ICD-10-CM | POA: Diagnosis not present

## 2021-07-09 DIAGNOSIS — D2272 Melanocytic nevi of left lower limb, including hip: Secondary | ICD-10-CM | POA: Diagnosis not present

## 2021-07-09 DIAGNOSIS — L821 Other seborrheic keratosis: Secondary | ICD-10-CM | POA: Diagnosis not present

## 2021-07-09 DIAGNOSIS — D2271 Melanocytic nevi of right lower limb, including hip: Secondary | ICD-10-CM | POA: Diagnosis not present

## 2021-07-09 DIAGNOSIS — D1801 Hemangioma of skin and subcutaneous tissue: Secondary | ICD-10-CM | POA: Diagnosis not present

## 2021-07-09 DIAGNOSIS — D2262 Melanocytic nevi of left upper limb, including shoulder: Secondary | ICD-10-CM | POA: Diagnosis not present

## 2021-07-09 DIAGNOSIS — L570 Actinic keratosis: Secondary | ICD-10-CM | POA: Diagnosis not present

## 2021-07-09 DIAGNOSIS — D2372 Other benign neoplasm of skin of left lower limb, including hip: Secondary | ICD-10-CM | POA: Diagnosis not present

## 2021-07-09 DIAGNOSIS — D485 Neoplasm of uncertain behavior of skin: Secondary | ICD-10-CM | POA: Diagnosis not present

## 2021-09-10 DIAGNOSIS — N951 Menopausal and female climacteric states: Secondary | ICD-10-CM | POA: Diagnosis not present

## 2021-09-17 DIAGNOSIS — R7301 Impaired fasting glucose: Secondary | ICD-10-CM | POA: Diagnosis not present

## 2021-09-17 DIAGNOSIS — I10 Essential (primary) hypertension: Secondary | ICD-10-CM | POA: Diagnosis not present

## 2021-09-17 DIAGNOSIS — E039 Hypothyroidism, unspecified: Secondary | ICD-10-CM | POA: Diagnosis not present

## 2021-09-24 DIAGNOSIS — E890 Postprocedural hypothyroidism: Secondary | ICD-10-CM | POA: Diagnosis not present

## 2021-09-24 DIAGNOSIS — R7301 Impaired fasting glucose: Secondary | ICD-10-CM | POA: Diagnosis not present

## 2021-09-24 DIAGNOSIS — I10 Essential (primary) hypertension: Secondary | ICD-10-CM | POA: Diagnosis not present

## 2021-09-24 DIAGNOSIS — E039 Hypothyroidism, unspecified: Secondary | ICD-10-CM | POA: Diagnosis not present

## 2021-11-10 DIAGNOSIS — L648 Other androgenic alopecia: Secondary | ICD-10-CM | POA: Diagnosis not present

## 2021-12-29 DIAGNOSIS — K648 Other hemorrhoids: Secondary | ICD-10-CM | POA: Diagnosis not present

## 2021-12-29 DIAGNOSIS — Z800 Family history of malignant neoplasm of digestive organs: Secondary | ICD-10-CM | POA: Diagnosis not present

## 2021-12-29 DIAGNOSIS — Z1211 Encounter for screening for malignant neoplasm of colon: Secondary | ICD-10-CM | POA: Diagnosis not present

## 2021-12-29 DIAGNOSIS — Z86010 Personal history of colonic polyps: Secondary | ICD-10-CM | POA: Diagnosis not present

## 2022-01-12 ENCOUNTER — Other Ambulatory Visit: Payer: Self-pay | Admitting: Family Medicine

## 2022-01-12 ENCOUNTER — Ambulatory Visit (INDEPENDENT_AMBULATORY_CARE_PROVIDER_SITE_OTHER): Payer: HMO | Admitting: Family Medicine

## 2022-01-12 VITALS — HR 82 | Resp 10

## 2022-01-12 DIAGNOSIS — R5383 Other fatigue: Secondary | ICD-10-CM | POA: Diagnosis not present

## 2022-01-12 DIAGNOSIS — R451 Restlessness and agitation: Secondary | ICD-10-CM

## 2022-01-12 DIAGNOSIS — R21 Rash and other nonspecific skin eruption: Secondary | ICD-10-CM

## 2022-01-12 DIAGNOSIS — R61 Generalized hyperhidrosis: Secondary | ICD-10-CM | POA: Diagnosis not present

## 2022-01-13 ENCOUNTER — Encounter: Payer: Self-pay | Admitting: Family Medicine

## 2022-01-13 DIAGNOSIS — E785 Hyperlipidemia, unspecified: Secondary | ICD-10-CM | POA: Diagnosis not present

## 2022-01-13 DIAGNOSIS — R7301 Impaired fasting glucose: Secondary | ICD-10-CM | POA: Insufficient documentation

## 2022-01-13 DIAGNOSIS — E559 Vitamin D deficiency, unspecified: Secondary | ICD-10-CM | POA: Diagnosis not present

## 2022-01-13 DIAGNOSIS — R739 Hyperglycemia, unspecified: Secondary | ICD-10-CM | POA: Diagnosis not present

## 2022-01-13 DIAGNOSIS — E039 Hypothyroidism, unspecified: Secondary | ICD-10-CM | POA: Insufficient documentation

## 2022-01-13 DIAGNOSIS — E890 Postprocedural hypothyroidism: Secondary | ICD-10-CM | POA: Insufficient documentation

## 2022-01-13 DIAGNOSIS — I10 Essential (primary) hypertension: Secondary | ICD-10-CM | POA: Insufficient documentation

## 2022-01-13 NOTE — Progress Notes (Signed)
? ?New Patient Office Visit ? ?Subjective   ? ?Patient ID: Lauren Lozano, female    DOB: 1951-05-14  Age: 71 y.o. MRN: [id number] ? ?CC:  ?Chief Complaint  ?Patient presents with  ? hormone therapy  ?  Hormone therapy ?  ? ? ?HPI ?Lauren Lozano presents to establish care ?71 year old female past medical history significant for hypothyroidism after partial thyroidectomy presents with chief complaint of fatigue and genitourinary concerns.  Patient was on hormone replacement therapy for her GU issues.  This helped with her sexual energy and fatigue.  And that she off the medication due to concern from her OB/GYN patient is now becoming symptomatic.  She has been using estradiol cream intravaginally which helps with feminine moisture.  Otherwise patient states that she feels ill and she would like a second opinion regarding going back on hormone therapy.  She however is resistant to resuming pellet injections due to the associated discomfort.  Of note one of the reasons patient's medication was discontinued was clitorimegaly from her testosterone use.  This was found to not be related to her anyway malignant.  But the provider providing the medication and hesitancy to resume hormonal therapy.  Patient's OB/GYN stated "you just need to live with it" in regards to patient's nontreated clinical symptoms.  This is what the patient reports. ? ?Outpatient Encounter Medications as of 01/12/2022  ?Medication Sig  ? gabapentin (NEURONTIN) 300 MG capsule Take 300 mg by mouth at bedtime.  ? hydrocortisone (ANUSOL-HC) 25 MG suppository Place 1 suppository (25 mg total) rectally daily.  ? thyroid (ARMOUR) 120 MG tablet Take 120 mg by mouth daily before breakfast.  ? ?No facility-administered encounter medications on file as of 01/12/2022.  ? ? ?Past Medical History:  ?Diagnosis Date  ? Chronic headaches   ? Hemorrhoids   ? Thyroid disease   ? ? ?Past Surgical History:  ?Procedure Laterality Date  ? APPENDECTOMY  1983  ?  CHOLECYSTECTOMY  1983  ? IR RADIOLOGIST EVAL & MGMT  03/15/2017  ? THYROIDECTOMY, PARTIAL    ? ? ?Family History  ?Problem Relation Age of Onset  ? Colon cancer Father   ? ? ?Social History  ? ?Socioeconomic History  ? Marital status: Married  ?  Spouse name: Not on file  ? Number of children: 3  ? Years of education: Not on file  ? Highest education level: Not on file  ?Occupational History  ? Occupation: retired  ?Tobacco Use  ? Smoking status: Never  ? Smokeless tobacco: Not on file  ?Substance and Sexual Activity  ? Alcohol use: Yes  ?  Alcohol/week: 7.0 - 14.0 standard drinks  ?  Types: 7 - 14 Standard drinks or equivalent per week  ? Drug use: No  ? Sexual activity: Not on file  ?Other Topics Concern  ? Not on file  ?Social History Narrative  ? Not on file  ? ?Social Determinants of Health  ? ?Financial Resource Strain: Not on file  ?Food Insecurity: Not on file  ?Transportation Needs: Not on file  ?Physical Activity: Not on file  ?Stress: Not on file  ?Social Connections: Not on file  ?Intimate Partner Violence: Not on file  ? ? ?Review of Systems  ?All other systems reviewed and are negative. ? ?  ? ? ?Objective   ? ?LMP 06/24/2015  ? ?Physical Exam ?Vitals and nursing note reviewed. Exam conducted with a chaperone present Shara Blazing).  ?Constitutional:   ?   General: She  is not in acute distress. ?   Appearance: Normal appearance. She is not ill-appearing.  ?HENT:  ?   Head: Normocephalic and atraumatic.  ?   Mouth/Throat:  ?   Mouth: Mucous membranes are moist.  ?   Pharynx: Oropharynx is clear.  ?Eyes:  ?   Extraocular Movements: Extraocular movements intact.  ?   Pupils: Pupils are equal, round, and reactive to light.  ?Cardiovascular:  ?   Rate and Rhythm: Normal rate and regular rhythm.  ?   Heart sounds: No murmur heard. ?  No friction rub. No gallop.  ?Pulmonary:  ?   Effort: Pulmonary effort is normal.  ?   Breath sounds: Normal breath sounds.  ?Abdominal:  ?   General: Abdomen is flat.  ?    Palpations: Abdomen is soft.  ?   Tenderness: There is no abdominal tenderness.  ?Genitourinary: ?   Pubic Area: Rash present.  ? ? ?Skin: ?   General: Skin is warm.  ?   Capillary Refill: Capillary refill takes less than 2 seconds.  ?   Findings: No rash.  ?Neurological:  ?   General: No focal deficit present.  ?   Mental Status: She is alert and oriented to person, place, and time.  ?Psychiatric:     ?   Mood and Affect: Mood normal.     ?   Behavior: Behavior normal.  ? ? ? ?  ? ?Assessment & Plan:  ? ?Problem List Items Addressed This Visit   ?None ?Visit Diagnoses   ? ? Fatigue, unspecified type    -  Primary  ? Relevant Orders  ? CBC with Differential/Platelet  ? Comprehensive metabolic panel  ? TSH + free T4  ? FSH/LH  ? Testosterone,Free and Total  ? Prolactin  ? Human Chorionic Gonadotropin (hCG),Quantitative (Serial Monitor)  ? Chronic night sweats      ? Relevant Orders  ? CBC with Differential/Platelet  ? Comprehensive metabolic panel  ? TSH + free T4  ? FSH/LH  ? Testosterone,Free and Total  ? Prolactin  ? Human Chorionic Gonadotropin (hCG),Quantitative (Serial Monitor)  ? Agitation      ? Relevant Orders  ? CBC with Differential/Platelet  ? Comprehensive metabolic panel  ? TSH + free T4  ? FSH/LH  ? Testosterone,Free and Total  ? Prolactin  ? Human Chorionic Gonadotropin (hCG),Quantitative (Serial Monitor)  ? Groin rash      ? ?  ? ?Groin rash ?- Small intertriginous area of slight erythema with super mild superficial induration, no satellite lesions and not suspecting fungal, trial of Augmentin since for resolution ?- Patient to come back if no resolution, she voiced understanding ? ?Menopause, with several symptoms ?Above cluster of symptoms likely related to cessation of hormone therapy, discussed case with OB/GYN Dr. Juanna Cao over the phone and he advises that hormone therapy can be continued lifelong as long as appropriate balancing hormones done and advised that complete lab panel as  ordered to be completed; discussed this plan of care with patient who is agreeable.  We will have patient follow-up in 2 weeks or so for lab work and discuss plan of care.  Patient does have her uterus and ovaries.  No history of malignancy.  Will review lab work to make sure that there are is no need for concern for anemia or other causes of patient's symptoms. ? ?No follow-ups on file.  ? ?Elwin Mocha, MD ? ? ?

## 2022-01-18 DIAGNOSIS — K641 Second degree hemorrhoids: Secondary | ICD-10-CM | POA: Diagnosis not present

## 2022-01-20 ENCOUNTER — Other Ambulatory Visit: Payer: Self-pay | Admitting: Internal Medicine

## 2022-01-20 DIAGNOSIS — Z0000 Encounter for general adult medical examination without abnormal findings: Secondary | ICD-10-CM | POA: Diagnosis not present

## 2022-01-20 DIAGNOSIS — E559 Vitamin D deficiency, unspecified: Secondary | ICD-10-CM | POA: Diagnosis not present

## 2022-01-20 DIAGNOSIS — Z1331 Encounter for screening for depression: Secondary | ICD-10-CM | POA: Diagnosis not present

## 2022-01-20 DIAGNOSIS — M8588 Other specified disorders of bone density and structure, other site: Secondary | ICD-10-CM | POA: Diagnosis not present

## 2022-01-20 DIAGNOSIS — E890 Postprocedural hypothyroidism: Secondary | ICD-10-CM | POA: Diagnosis not present

## 2022-01-20 DIAGNOSIS — Z86010 Personal history of colonic polyps: Secondary | ICD-10-CM | POA: Diagnosis not present

## 2022-01-20 DIAGNOSIS — E785 Hyperlipidemia, unspecified: Secondary | ICD-10-CM

## 2022-01-20 DIAGNOSIS — R739 Hyperglycemia, unspecified: Secondary | ICD-10-CM | POA: Diagnosis not present

## 2022-01-20 DIAGNOSIS — Z1339 Encounter for screening examination for other mental health and behavioral disorders: Secondary | ICD-10-CM | POA: Diagnosis not present

## 2022-02-02 ENCOUNTER — Ambulatory Visit (INDEPENDENT_AMBULATORY_CARE_PROVIDER_SITE_OTHER): Payer: HMO | Admitting: Family Medicine

## 2022-02-02 DIAGNOSIS — T887XXA Unspecified adverse effect of drug or medicament, initial encounter: Secondary | ICD-10-CM

## 2022-02-02 DIAGNOSIS — Z780 Asymptomatic menopausal state: Secondary | ICD-10-CM

## 2022-02-02 DIAGNOSIS — R5383 Other fatigue: Secondary | ICD-10-CM

## 2022-02-02 DIAGNOSIS — R7989 Other specified abnormal findings of blood chemistry: Secondary | ICD-10-CM

## 2022-02-02 LAB — TESTOSTERONE,FREE AND TOTAL
Testosterone, Free: 9.6 pg/mL — ABNORMAL HIGH (ref 0.0–4.2)
Testosterone: 127 ng/dL — ABNORMAL HIGH (ref 3–67)

## 2022-02-02 LAB — CBC WITH DIFFERENTIAL/PLATELET
Basophils Absolute: 0 10*3/uL (ref 0.0–0.2)
Basos: 1 %
EOS (ABSOLUTE): 0.2 10*3/uL (ref 0.0–0.4)
Eos: 3 %
Hematocrit: 48.9 % — ABNORMAL HIGH (ref 34.0–46.6)
Hemoglobin: 16.8 g/dL — ABNORMAL HIGH (ref 11.1–15.9)
Immature Grans (Abs): 0 10*3/uL (ref 0.0–0.1)
Immature Granulocytes: 0 %
Lymphocytes Absolute: 1.7 10*3/uL (ref 0.7–3.1)
Lymphs: 29 %
MCH: 33.5 pg — ABNORMAL HIGH (ref 26.6–33.0)
MCHC: 34.4 g/dL (ref 31.5–35.7)
MCV: 98 fL — ABNORMAL HIGH (ref 79–97)
Monocytes Absolute: 0.5 10*3/uL (ref 0.1–0.9)
Monocytes: 8 %
Neutrophils Absolute: 3.6 10*3/uL (ref 1.4–7.0)
Neutrophils: 59 %
Platelets: 210 10*3/uL (ref 150–450)
RBC: 5.01 x10E6/uL (ref 3.77–5.28)
RDW: 11.9 % (ref 11.7–15.4)
WBC: 6 10*3/uL (ref 3.4–10.8)

## 2022-02-02 LAB — HUMAN CHORIONIC GONADOTROPIN(HCG),B-SUBUNIT,QUANTITATIVE): HCG, Beta Chain, Quant, S: 3 m[IU]/mL

## 2022-02-02 LAB — FSH/LH
FSH: 45.4 m[IU]/mL
LH: 34 m[IU]/mL

## 2022-02-02 LAB — COMPREHENSIVE METABOLIC PANEL
ALT: 30 IU/L (ref 0–32)
AST: 20 IU/L (ref 0–40)
Albumin/Globulin Ratio: 2.1 (ref 1.2–2.2)
Albumin: 5.2 g/dL — ABNORMAL HIGH (ref 3.7–4.7)
Alkaline Phosphatase: 66 IU/L (ref 44–121)
BUN/Creatinine Ratio: 27 (ref 12–28)
BUN: 19 mg/dL (ref 8–27)
Bilirubin Total: 0.4 mg/dL (ref 0.0–1.2)
CO2: 24 mmol/L (ref 20–29)
Calcium: 10.6 mg/dL — ABNORMAL HIGH (ref 8.7–10.3)
Chloride: 103 mmol/L (ref 96–106)
Creatinine, Ser: 0.7 mg/dL (ref 0.57–1.00)
Globulin, Total: 2.5 g/dL (ref 1.5–4.5)
Glucose: 106 mg/dL — ABNORMAL HIGH (ref 70–99)
Potassium: 4.5 mmol/L (ref 3.5–5.2)
Sodium: 142 mmol/L (ref 134–144)
Total Protein: 7.7 g/dL (ref 6.0–8.5)
eGFR: 91 mL/min/{1.73_m2} (ref >59)

## 2022-02-02 LAB — TSH+FREE T4
Free T4: 0.92 ng/dL (ref 0.82–1.77)
TSH: 1.54 u[IU]/mL (ref 0.450–4.500)

## 2022-02-02 LAB — PROLACTIN: Prolactin: 6.4 ng/mL (ref 4.8–23.3)

## 2022-02-06 DIAGNOSIS — J029 Acute pharyngitis, unspecified: Secondary | ICD-10-CM | POA: Diagnosis not present

## 2022-02-10 DIAGNOSIS — J069 Acute upper respiratory infection, unspecified: Secondary | ICD-10-CM | POA: Diagnosis not present

## 2022-02-10 DIAGNOSIS — R0982 Postnasal drip: Secondary | ICD-10-CM | POA: Diagnosis not present

## 2022-02-10 DIAGNOSIS — J028 Acute pharyngitis due to other specified organisms: Secondary | ICD-10-CM | POA: Diagnosis not present

## 2022-02-10 DIAGNOSIS — K641 Second degree hemorrhoids: Secondary | ICD-10-CM | POA: Diagnosis not present

## 2022-02-15 MED ORDER — ESTRING 2 MG VA RING: 2.0000 mg | VAGINAL_RING | VAGINAL | 3 refills | Status: DC

## 2022-02-15 NOTE — Progress Notes (Signed)
Elevated testosterone level.  Patient told to stop her DHEA she was getting off the Internet.  We will recheck a level in 1 Andal in 3 months.  Patient voiced understanding.  Patient opted for Estring.  Prescribed.  Plan is to follow-up with patient every 3 to 4 months for these issues.  Patient reassured.  She voiced understanding medications precautions.  All questions answered.  Dr. Harmon Pier Williams-OB/GYN was talked to via phone and consult regarding this patient and her hormone therapy.  Elwin Mocha, MD

## 2022-03-08 ENCOUNTER — Other Ambulatory Visit: Payer: Medicare Other

## 2022-03-09 ENCOUNTER — Ambulatory Visit
Admission: RE | Admit: 2022-03-09 | Discharge: 2022-03-09 | Disposition: A | Discharge status: Home / Self Care | Payer: No Typology Code available for payment source | Source: Ambulatory Visit | Attending: Internal Medicine | Admitting: Internal Medicine

## 2022-03-09 DIAGNOSIS — E785 Hyperlipidemia, unspecified: Secondary | ICD-10-CM

## 2022-03-25 DIAGNOSIS — K641 Second degree hemorrhoids: Secondary | ICD-10-CM | POA: Diagnosis not present

## 2022-04-06 ENCOUNTER — Ambulatory Visit (HOSPITAL_COMMUNITY)
Admission: RE | Admit: 2022-04-06 | Discharge: 2022-04-06 | Disposition: A | Discharge status: Home / Self Care | Payer: PPO | Source: Ambulatory Visit | Attending: Family Medicine | Admitting: Family Medicine

## 2022-04-06 ENCOUNTER — Encounter (HOSPITAL_COMMUNITY): Payer: Self-pay

## 2022-04-06 VITALS — BP 133/80 | HR 74 | Temp 98.7°F | Resp 18

## 2022-04-06 DIAGNOSIS — S81811A Laceration without foreign body, right lower leg, initial encounter: Secondary | ICD-10-CM | POA: Diagnosis not present

## 2022-04-06 MED ORDER — MUPIROCIN 2 % EX OINT: 1.0000 | TOPICAL_OINTMENT | Freq: Two times a day (BID) | CUTANEOUS | 0 refills | Status: DC

## 2022-04-06 NOTE — ED Triage Notes (Signed)
Patient fell down steps 1 week ago and cut right lower leg would like it checked to see if its infected. She put triple antibiotic ointment on wound.

## 2022-04-07 NOTE — ED Provider Notes (Signed)
  [place]   [id number] 04/06/22 Arrival Time: 8366  ASSESSMENT & PLAN:  1. Laceration of right lower leg, initial encounter     Meds ordered this encounter  Medications   mupirocin ointment (BACTROBAN) 2 %    Sig: Apply 1 Application topically 2 (two) times daily.    Dispense:  22 g    Refill:  0   Looks good. Healing well. May use Bactroban sparingly while healing.   Reviewed expectations re: course of current medical issues. Questions answered. Outlined signs and symptoms indicating need for more acute intervention. Patient verbalized understanding. After Visit Summary given.   SUBJECTIVE:  Lauren Lozano is a 71 y.o. female who presents with a laceration/wound to RLE; last week; here for wound check as she is leaving for [place] in the next few days. No drainage or bleeding. Normal ambulation. No extremity sensation changes or weakness. Afebrile.   OBJECTIVE:  Vitals:   04/06/22 1506  BP: 133/80  Pulse: 74  Resp: 18  Temp: 98.7 F (37.1 C)  TempSrc: Oral  SpO2: 97%     General appearance: alert; no distress Skin: healing oval shaped wound over anterior RLE; size: approx 1.5 x 0.5 cm; clean wound edges, no foreign bodies; without active bleeding; no signs of infection Psychological: alert and cooperative; normal mood and affect    Allergies  Allergen Reactions   Levothyroxine Sodium Other (See Comments)    Past Medical History:  Diagnosis Date   Chronic headaches    Hemorrhoids    Thyroid disease    Social History   Socioeconomic History   Marital status: Married    Spouse name: Not on file   Number of children: 3   Years of education: Not on file   Highest education level: Not on file  Occupational History   Occupation: retired  Tobacco Use   Smoking status: Never   Smokeless tobacco: Not on file  Vaping Use   Vaping Use: Never used  Substance and Sexual Activity   Alcohol use: Yes    Alcohol/week: 7.0 - 14.0 standard drinks  of alcohol    Types: 7 - 14 Standard drinks or equivalent per week   Drug use: No   Sexual activity: Not on file  Other Topics Concern   Not on file  Social History Narrative   Not on file   Social Determinants of Health   Financial Resource Strain: Not on file  Food Insecurity: Not on file  Transportation Needs: Not on file  Physical Activity: Not on file  Stress: Not on file  Social Connections: Not on file          Vanessa Kick, MD 04/07/22 [phone]

## 2022-04-21 DIAGNOSIS — Z01419 Encounter for gynecological examination (general) (routine) without abnormal findings: Secondary | ICD-10-CM | POA: Diagnosis not present

## 2022-04-21 DIAGNOSIS — Z1231 Encounter for screening mammogram for malignant neoplasm of breast: Secondary | ICD-10-CM | POA: Diagnosis not present

## 2022-04-21 DIAGNOSIS — Z6821 Body mass index (BMI) 21.0-21.9, adult: Secondary | ICD-10-CM | POA: Diagnosis not present

## 2022-08-16 DIAGNOSIS — Z13811 Encounter for screening for lower gastrointestinal disorder: Secondary | ICD-10-CM | POA: Diagnosis not present

## 2022-08-25 DIAGNOSIS — L570 Actinic keratosis: Secondary | ICD-10-CM | POA: Diagnosis not present

## 2022-08-25 DIAGNOSIS — D485 Neoplasm of uncertain behavior of skin: Secondary | ICD-10-CM | POA: Diagnosis not present

## 2022-09-15 DIAGNOSIS — R7301 Impaired fasting glucose: Secondary | ICD-10-CM | POA: Diagnosis not present

## 2022-09-15 DIAGNOSIS — I10 Essential (primary) hypertension: Secondary | ICD-10-CM | POA: Diagnosis not present

## 2022-09-15 DIAGNOSIS — E039 Hypothyroidism, unspecified: Secondary | ICD-10-CM | POA: Diagnosis not present

## 2022-09-22 DIAGNOSIS — R7301 Impaired fasting glucose: Secondary | ICD-10-CM | POA: Diagnosis not present

## 2022-09-22 DIAGNOSIS — E890 Postprocedural hypothyroidism: Secondary | ICD-10-CM | POA: Diagnosis not present

## 2022-09-22 DIAGNOSIS — I10 Essential (primary) hypertension: Secondary | ICD-10-CM | POA: Diagnosis not present

## 2022-09-22 DIAGNOSIS — E039 Hypothyroidism, unspecified: Secondary | ICD-10-CM | POA: Diagnosis not present

## 2022-10-06 DIAGNOSIS — R1013 Epigastric pain: Secondary | ICD-10-CM | POA: Diagnosis not present

## 2022-10-06 DIAGNOSIS — K5909 Other constipation: Secondary | ICD-10-CM | POA: Diagnosis not present

## 2022-12-21 DIAGNOSIS — D3612 Benign neoplasm of peripheral nerves and autonomic nervous system, upper limb, including shoulder: Secondary | ICD-10-CM | POA: Diagnosis not present

## 2022-12-21 DIAGNOSIS — D485 Neoplasm of uncertain behavior of skin: Secondary | ICD-10-CM | POA: Diagnosis not present

## 2022-12-21 DIAGNOSIS — D2271 Melanocytic nevi of right lower limb, including hip: Secondary | ICD-10-CM | POA: Diagnosis not present

## 2022-12-21 DIAGNOSIS — L649 Androgenic alopecia, unspecified: Secondary | ICD-10-CM | POA: Diagnosis not present

## 2022-12-21 DIAGNOSIS — D2372 Other benign neoplasm of skin of left lower limb, including hip: Secondary | ICD-10-CM | POA: Diagnosis not present

## 2022-12-21 DIAGNOSIS — L309 Dermatitis, unspecified: Secondary | ICD-10-CM | POA: Diagnosis not present

## 2022-12-21 DIAGNOSIS — L821 Other seborrheic keratosis: Secondary | ICD-10-CM | POA: Diagnosis not present

## 2022-12-31 ENCOUNTER — Ambulatory Visit: Payer: Self-pay | Admitting: Podiatry

## 2023-01-04 ENCOUNTER — Ambulatory Visit (INDEPENDENT_AMBULATORY_CARE_PROVIDER_SITE_OTHER): Payer: PPO

## 2023-01-04 ENCOUNTER — Encounter: Payer: Self-pay | Admitting: Podiatry

## 2023-01-04 ENCOUNTER — Ambulatory Visit (INDEPENDENT_AMBULATORY_CARE_PROVIDER_SITE_OTHER): Payer: PPO | Admitting: Podiatry

## 2023-01-04 DIAGNOSIS — M7661 Achilles tendinitis, right leg: Secondary | ICD-10-CM

## 2023-01-04 DIAGNOSIS — M778 Other enthesopathies, not elsewhere classified: Secondary | ICD-10-CM

## 2023-01-04 MED ORDER — MELOXICAM 15 MG PO TABS: 15.0000 mg | ORAL_TABLET | Freq: Every day | ORAL | 3 refills | Status: DC

## 2023-01-04 MED ORDER — METHYLPREDNISOLONE 4 MG PO TBPK: ORAL_TABLET | ORAL | 0 refills | Status: DC

## 2023-01-05 NOTE — Progress Notes (Signed)
  Subjective:  Patient ID: Lauren Lozano, female    DOB: 09-07-1950,  MRN: [id number] HPI Chief Complaint  Patient presents with   Foot Pain    Posterior heel right - aching, swelling, knot x 20 years, had treatment with meds and orthotics many years ago, but never helped, tried Ibuprofen-no help   New Patient (Initial Visit)    72 y.o. female presents with the above complaint.   ROS: Denies fever chills nausea vomit muscle aches pains calf pain back pain chest pain shortness of breath.  Past Medical History:  Diagnosis Date   Chronic headaches    Hemorrhoids    Thyroid disease    Past Surgical History:  Procedure Laterality Date   APPENDECTOMY  1983   CHOLECYSTECTOMY  1983   IR RADIOLOGIST EVAL & MGMT  03/15/2017   THYROIDECTOMY, PARTIAL      Current Outpatient Medications:    meloxicam (MOBIC) 15 MG tablet, Take 1 tablet (15 mg total) by mouth daily., Disp: 30 tablet, Rfl: 3   methylPREDNISolone (MEDROL DOSEPAK) 4 MG TBPK tablet, 6 day dose pack - take as directed, Disp: 21 tablet, Rfl: 0   thyroid (ARMOUR) 120 MG tablet, Take 120 mg by mouth daily before breakfast., Disp: , Rfl:   No Known Allergies Review of Systems Objective:  There were no vitals filed for this visit.  General: Well developed, nourished, in no acute distress, alert and oriented x3   Dermatological: Skin is warm, dry and supple bilateral. Nails x 10 are well maintained; remaining integument appears unremarkable at this time. There are no open sores, no preulcerative lesions, no rash or signs of infection present.  Vascular: Dorsalis Pedis artery and Posterior Tibial artery pedal pulses are 2/4 bilateral with immedate capillary fill time. Pedal hair growth present. No varicosities and no lower extremity edema present bilateral.   Neruologic: Grossly intact via light touch bilateral. Vibratory intact via tuning fork bilateral. Protective threshold with Semmes Wienstein monofilament intact to all pedal  sites bilateral. Patellar and Achilles deep tendon reflexes 2+ bilateral. No Babinski or clonus noted bilateral.   Musculoskeletal: No gross boney pedal deformities bilateral. No pain, crepitus, or limitation noted with foot and ankle range of motion bilateral. Muscular strength 5/5 in all groups tested bilateral.  She has pain on palpation of the posterior aspect of the right heel with fluctuance.  Appears to be a retrocalcaneal retro-Achilles bursitis.  Gait: Unassisted, Nonantalgic.    Radiographs:  Radiographs taken today demonstrate a retrocalcaneal heel spur soft tissue increase in density of the Achilles of the right heel.  There is calcification also noted.   Assessment & Plan:   Assessment: Achilles insertional tendinitis bursitis.  Plan: Started her on methylprednisolone to be followed by meloxicam.  She has a boot at home that I highly recommended she get into.  I will follow-up with her in about 4 weeks     Ziquan Fidel T. [place], [place]

## 2023-02-01 ENCOUNTER — Ambulatory Visit: Payer: PPO | Admitting: Podiatry

## 2023-02-01 ENCOUNTER — Encounter: Payer: Self-pay | Admitting: Podiatry

## 2023-02-01 DIAGNOSIS — S93691A Other sprain of right foot, initial encounter: Secondary | ICD-10-CM

## 2023-02-01 NOTE — Progress Notes (Signed)
She presents today for follow-up of her Achilles tendinitis of her right foot states that she wore the boot for couple weeks which actually made the foot feel worse plantarly but the Achilles tendinitis is doing much better.  She states that the initial area is better but the heel it just hurts about I can hardly stand it.  Objective: Vital signs stable oriented x 3.  Pulses are palpable.  There is no erythema Dem salines drainage or odor no reproducible pain other than her standing.  Assessment: Cannot rule out a tear of the plantar fascia or of the Achilles or stress injury intra calcaneal.  Plan: Due to patient's symptomatology worsening and her becoming immobile in the her inability for her daily activities I feel it necessary for MRI to be performed for differential diagnosis as well as surgical consideration.

## 2023-02-06 ENCOUNTER — Ambulatory Visit
Admission: RE | Admit: 2023-02-06 | Discharge: 2023-02-06 | Disposition: A | Discharge status: Home / Self Care | Payer: PPO | Source: Ambulatory Visit | Attending: Podiatry | Admitting: Podiatry

## 2023-02-06 DIAGNOSIS — M25571 Pain in right ankle and joints of right foot: Secondary | ICD-10-CM | POA: Diagnosis not present

## 2023-02-06 DIAGNOSIS — M67873 Other specified disorders of tendon, right ankle and foot: Secondary | ICD-10-CM | POA: Diagnosis not present

## 2023-02-06 DIAGNOSIS — S93691A Other sprain of right foot, initial encounter: Secondary | ICD-10-CM

## 2023-02-09 DIAGNOSIS — E890 Postprocedural hypothyroidism: Secondary | ICD-10-CM | POA: Diagnosis not present

## 2023-02-09 DIAGNOSIS — R7989 Other specified abnormal findings of blood chemistry: Secondary | ICD-10-CM | POA: Diagnosis not present

## 2023-02-09 DIAGNOSIS — E559 Vitamin D deficiency, unspecified: Secondary | ICD-10-CM | POA: Diagnosis not present

## 2023-02-09 DIAGNOSIS — E785 Hyperlipidemia, unspecified: Secondary | ICD-10-CM | POA: Diagnosis not present

## 2023-02-09 DIAGNOSIS — R739 Hyperglycemia, unspecified: Secondary | ICD-10-CM | POA: Diagnosis not present

## 2023-02-15 ENCOUNTER — Ambulatory Visit: Payer: PPO | Admitting: Podiatry

## 2023-02-22 DIAGNOSIS — Z1331 Encounter for screening for depression: Secondary | ICD-10-CM | POA: Diagnosis not present

## 2023-02-22 DIAGNOSIS — K5909 Other constipation: Secondary | ICD-10-CM | POA: Diagnosis not present

## 2023-02-22 DIAGNOSIS — Z1339 Encounter for screening examination for other mental health and behavioral disorders: Secondary | ICD-10-CM | POA: Diagnosis not present

## 2023-02-22 DIAGNOSIS — M8588 Other specified disorders of bone density and structure, other site: Secondary | ICD-10-CM | POA: Diagnosis not present

## 2023-02-22 DIAGNOSIS — E559 Vitamin D deficiency, unspecified: Secondary | ICD-10-CM | POA: Diagnosis not present

## 2023-02-22 DIAGNOSIS — Z0000 Encounter for general adult medical examination without abnormal findings: Secondary | ICD-10-CM | POA: Diagnosis not present

## 2023-02-22 DIAGNOSIS — R739 Hyperglycemia, unspecified: Secondary | ICD-10-CM | POA: Diagnosis not present

## 2023-02-22 DIAGNOSIS — Z86010 Personal history of colonic polyps: Secondary | ICD-10-CM | POA: Diagnosis not present

## 2023-02-22 DIAGNOSIS — E890 Postprocedural hypothyroidism: Secondary | ICD-10-CM | POA: Diagnosis not present

## 2023-02-22 DIAGNOSIS — E785 Hyperlipidemia, unspecified: Secondary | ICD-10-CM | POA: Diagnosis not present

## 2023-02-24 ENCOUNTER — Ambulatory Visit: Payer: PPO | Admitting: Podiatry

## 2023-02-24 DIAGNOSIS — M722 Plantar fascial fibromatosis: Secondary | ICD-10-CM

## 2023-02-24 DIAGNOSIS — M6788 Other specified disorders of synovium and tendon, other site: Secondary | ICD-10-CM

## 2023-02-24 NOTE — Progress Notes (Signed)
Chief Complaint  Patient presents with   Foot Pain    Patient came in today for right ankle pain, follow-up, MRI results, patient is still having pain but states she is doing better, rate of pain 5 out of 10,     HPI: 72 y.o. female presenting today with c/o pain in the back of the right heel.  Patient notes that she has already reviewed her MRI report through MyChart.  She was concerned noting the small interstitial tear in the area.  She notes that she does not tolerate the cam walker as it makes the bottom of her heel hurt.  It is not hurting today.  She does have pain with for steps out of bed in the morning.  Past Medical History:  Diagnosis Date   Chronic headaches    Hemorrhoids    Thyroid disease     Past Surgical History:  Procedure Laterality Date   APPENDECTOMY  1983   CHOLECYSTECTOMY  1983   IR RADIOLOGIST EVAL & MGMT  03/15/2017   THYROIDECTOMY, PARTIAL      No Known Allergies   Physical Exam: General: The patient is alert and oriented x3 in no acute distress.  Dermatology:  No ecchymosis, erythema, or edema bilateral.  No open lesions.    Vascular: Palpable pedal pulses bilaterally. Capillary refill within normal limits.  Minimal edema posterior right heel.    Neurological: Light touch sensation intact bilateral.  MMT 5/5 to lower extremity bilateral. Negative Tinel's sign with percussion of the posterior tibial nerve on the affected extremity.    Musculoskeletal Exam:  There is pain on palpation of the posterior aspect of the right heel.  No palpable gaps or nodules noted within the achilles tendon.  Antalgic gait noted with first steps out of exam chair.  No pain on palpation of the plantar heel.  No ecchymosis or edema to posterior heel.  MRI results (right ankle 02/06/2019 without contrast),:  Narrative & Impression  CLINICAL DATA:  Right ankle pain.   EXAM: MRI OF THE RIGHT ANKLE WITHOUT CONTRAST   TECHNIQUE: Multiplanar, multisequence MR imaging  of the ankle was performed. No intravenous contrast was administered.   COMPARISON:  None Available.   FINDINGS: Patient motion degrades image quality limiting evaluation.   TENDONS   Peroneal: Peroneal longus tendon intact. Peroneal brevis intact.   Posteromedial: Posterior tibial tendon intact. Flexor hallucis longus tendon intact. Flexor digitorum longus tendon intact.   Anterior: Tibialis anterior tendon intact. Extensor hallucis longus tendon intact Extensor digitorum longus tendon intact.   Achilles: Moderate tendinosis of the distal Achilles tendon with a small interstitial tear and mild subcortical reactive marrow edema at the calcaneal insertion. Trace amount of fluid in the retrocalcaneal bursa.   Plantar Fascia: Intact.   LIGAMENTS   Lateral: Anterior talofibular ligament intact. Calcaneofibular ligament intact. Posterior talofibular ligament intact. Anterior and posterior tibiofibular ligaments intact.   Medial: Deltoid ligament intact. Spring ligament intact.   CARTILAGE   Ankle Joint: No joint effusion. Normal ankle mortise. No chondral defect.   Subtalar Joints/Sinus Tarsi: Normal subtalar joints. No subtalar joint effusion. Normal sinus tarsi.   Bones: No aggressive osseous lesion. No fracture or dislocation.   Soft Tissue: No fluid collection or hematoma. Muscles are normal without edema or atrophy. Tarsal tunnel is normal.   IMPRESSION: 1. Moderate tendinosis of the distal Achilles tendon with a small interstitial tear and mild subcortical reactive marrow edema at the calcaneal insertion.  Electronically Signed   By: Elige Ko M.D.   On: 02/17/2023 08:05    Assessment/Plan of Care: 1. Achilles tendinosis     FOR HOME USE ONLY DME NIGHT SPLINT  Reviewed treatment options and explained the difference between tendinitis versus tendinosis today.  She would benefit from 1, or combination of, dry needling, prolotherapy, for PRP injection  to the Achilles tendon.  This would be our best treatment option before considering surgical intervention.  Informed the patient that in the past PRP injections of the Achilles tendon have worked extremely well and her prognosis would be very good.  She was amenable to being scheduled for the PRP injection of the right Achilles.  Will get this scheduled and performed in the office at her convenience.  Patient was made aware this is a noncovered service.  The patient was fitted for a prefabricated night splint, which is a static AFO with a soft interface material that she will wear at all times nonweight bearing/when sleeping.  This may help since she cannot tolerate a cam walker at this time.   Return in about 2 weeks (around 03/10/2023) for PRP injection right achilles (please let her know cost of PRP injection) .   Clerance Lav, DPM, FACFAS [facility]     [address] [place], [place] [address]                Office [phone]  Fax [phone]

## 2023-03-17 DIAGNOSIS — M7661 Achilles tendinitis, right leg: Secondary | ICD-10-CM | POA: Diagnosis not present

## 2023-03-18 DIAGNOSIS — R262 Difficulty in walking, not elsewhere classified: Secondary | ICD-10-CM | POA: Diagnosis not present

## 2023-03-18 DIAGNOSIS — M7661 Achilles tendinitis, right leg: Secondary | ICD-10-CM | POA: Diagnosis not present

## 2023-04-12 DIAGNOSIS — M7661 Achilles tendinitis, right leg: Secondary | ICD-10-CM | POA: Diagnosis not present

## 2023-07-27 DIAGNOSIS — E039 Hypothyroidism, unspecified: Secondary | ICD-10-CM | POA: Diagnosis not present

## 2023-07-27 DIAGNOSIS — R7301 Impaired fasting glucose: Secondary | ICD-10-CM | POA: Diagnosis not present

## 2023-08-30 DIAGNOSIS — Z6822 Body mass index (BMI) 22.0-22.9, adult: Secondary | ICD-10-CM | POA: Diagnosis not present

## 2023-08-30 DIAGNOSIS — Z01419 Encounter for gynecological examination (general) (routine) without abnormal findings: Secondary | ICD-10-CM | POA: Diagnosis not present

## 2023-08-30 DIAGNOSIS — Z1231 Encounter for screening mammogram for malignant neoplasm of breast: Secondary | ICD-10-CM | POA: Diagnosis not present

## 2023-08-30 DIAGNOSIS — Z959 Presence of cardiac and vascular implant and graft, unspecified: Secondary | ICD-10-CM | POA: Diagnosis not present

## 2023-09-23 DIAGNOSIS — E890 Postprocedural hypothyroidism: Secondary | ICD-10-CM | POA: Diagnosis not present

## 2023-09-23 DIAGNOSIS — E039 Hypothyroidism, unspecified: Secondary | ICD-10-CM | POA: Diagnosis not present

## 2023-09-30 DIAGNOSIS — R7301 Impaired fasting glucose: Secondary | ICD-10-CM | POA: Diagnosis not present

## 2023-09-30 DIAGNOSIS — E890 Postprocedural hypothyroidism: Secondary | ICD-10-CM | POA: Diagnosis not present

## 2023-09-30 DIAGNOSIS — E039 Hypothyroidism, unspecified: Secondary | ICD-10-CM | POA: Diagnosis not present

## 2023-09-30 DIAGNOSIS — I10 Essential (primary) hypertension: Secondary | ICD-10-CM | POA: Diagnosis not present

## 2023-10-12 ENCOUNTER — Ambulatory Visit: Payer: PPO | Admitting: Allergy

## 2023-10-13 ENCOUNTER — Other Ambulatory Visit: Payer: Self-pay

## 2023-10-13 ENCOUNTER — Ambulatory Visit: Payer: PPO | Admitting: Internal Medicine

## 2023-10-13 ENCOUNTER — Encounter: Payer: Self-pay | Admitting: Internal Medicine

## 2023-10-13 VITALS — BP 124/76 | HR 78 | Temp 98.3°F | Ht 65.0 in | Wt 134.5 lb

## 2023-10-13 DIAGNOSIS — R140 Abdominal distension (gaseous): Secondary | ICD-10-CM | POA: Diagnosis not present

## 2023-10-13 DIAGNOSIS — K9049 Malabsorption due to intolerance, not elsewhere classified: Secondary | ICD-10-CM | POA: Diagnosis not present

## 2023-10-13 DIAGNOSIS — R1013 Epigastric pain: Secondary | ICD-10-CM

## 2023-10-13 NOTE — Patient Instructions (Addendum)
Epigastric Pain, Bloating - Consider low FODMAP diet.  - Recommend GI evaluation.

## 2023-10-13 NOTE — Progress Notes (Addendum)
NEW PATIENT  Date of Service/Encounter:  10/13/23  Consult requested by: Marcelo Baldy, PA-C   Subjective:   Lauren Lozano (DOB: 10-09-1950) is a 73 y.o. female who presents to the clinic on 10/13/2023 with a chief complaint of food reactions.  History obtained from: chart review and patient.   Reports onset of burning, stomach pain, bloating, gas for the last 3 years to the point she is waking up from it.  Occurring almost daily.  On PPI which helps somewhat.  No diarrhea/vomiting.  Some chronic constipation.  Colonoscopy 2 years ago was normal.  Had a EGD around age 64, nothing recently with onset of these symptoms. Reports negative celiac testing, daughter has celiac.   No wheezing, coughing, SOB, hives, swelling with ingestion of food. Never had food allergies. Does not eat red meat much, never pork and no correlation with symptoms.  Mostly eats seafood.   Did online food sensitivity hair panel that showed reactivity to almost all the foods.   Reviewed:  09/30/2023: seen by [facility] for hypothyroidism, HTN, impaired fasting glucose. On NP Thyroid.   12/21/2022: followed by [facility] for alopecia, seborrheic keratosis and melanocytic nevi.   02/10/2022: seen by [facility] for post nasal drip , treated as acute pharyngitis.   Past Medical History: Past Medical History:  Diagnosis Date   Chronic headaches    Hemorrhoids    Thyroid disease    Past Surgical History: Past Surgical History:  Procedure Laterality Date   APPENDECTOMY  1983   CHOLECYSTECTOMY  1983   IR RADIOLOGIST EVAL & MGMT  03/15/2017   THYROIDECTOMY, PARTIAL      Family History: Family History  Problem Relation Age of Onset   Colon cancer Father     Medication List:  Allergies as of 10/13/2023   No Known Allergies      Medication List        Accurate as of October 13, 2023 10:07 AM. If you have any questions, ask your nurse or doctor.          ALPRAZolam 0.25 MG  tablet Commonly known as: XANAX Take 0.25 mg by mouth at bedtime as needed.   Bergamot Serenity Oil as directed Inhalation   Biotin 47829 MCG Tabs as directed Orally   cyanocobalamin 100 MCG tablet Take 250 mcg by mouth.   estradiol 0.0375 MG/24HR Commonly known as: VIVELLE-DOT Place 1 patch onto the skin 2 (two) times a week.   Fatty Acid Base Misc See admin instructions.   Lecithin 1200 MG Caps as directed Orally   Maca 500 MG Caps as directed Orally   Melatonin 5 MG Chew Chew 5 mg by mouth daily.   pantoprazole 40 MG tablet Commonly known as: PROTONIX Take 40 mg by mouth daily.   progesterone 100 MG capsule Commonly known as: PROMETRIUM Take 100 mg by mouth daily.   Proline 500 MG Caps as directed Orally   saccharomyces boulardii 250 MG capsule Commonly known as: FLORASTOR Take 250 mg by mouth 2 (two) times daily.   thyroid 120 MG tablet Commonly known as: ARMOUR Take 120 mg by mouth daily before breakfast.   Vitamin D-3 5000 UNIT/ML Liqd         REVIEW OF SYSTEMS: Pertinent positives and negatives discussed in HPI.   Objective:   Physical Exam: BP 124/76 (BP Location: Left Arm, Patient Position: Sitting, Cuff Size: Normal)   Pulse 78   Temp 98.3 F (36.8 C) (Temporal)   Ht  5\' 5"  (1.651 m)   LMP 06/24/2015   SpO2 97%   BMI 21.99 kg/m  Body mass index is 21.99 kg/m. GEN: alert, well developed HEENT: clear conjunctiva, MMM HEART: regular rate and rhythm, no murmur LUNGS: clear to auscultation bilaterally, no coughing, unlabored respiration ABDOMEN: soft, non distended  SKIN: no rashes or lesions  Assessment:   1. Epigastric pain   2. Bloating   3. Food intolerance     Plan/Recommendations:  Epigastric Pain, Bloating - Discussed food allergy testing (blood or skin) are not helpful for nonspecific symptoms as they are looking for IgE mediated food allergies; her symptoms are not consistent with an IgE mediated food allergy. -  Reports prior negative celiac testing. Does not consume red meat much and no correlation of symptoms with red meat ingestion.  - Has been on PPI with minimal relief.   - Consider low FODMAP diet.  - Recommend GI evaluation.  Reports no recent EGD with onset of symptoms. She will find a GI on her own and contact them.  - Also discussed food sensitivity/intolerance panels have no clinical utility.    Return if symptoms worsen or fail to improve.  Alesia Morin, MD [facility]

## 2024-01-09 DIAGNOSIS — L819 Disorder of pigmentation, unspecified: Secondary | ICD-10-CM | POA: Diagnosis not present

## 2024-01-09 DIAGNOSIS — L820 Inflamed seborrheic keratosis: Secondary | ICD-10-CM | POA: Diagnosis not present

## 2024-01-09 DIAGNOSIS — D3612 Benign neoplasm of peripheral nerves and autonomic nervous system, upper limb, including shoulder: Secondary | ICD-10-CM | POA: Diagnosis not present

## 2024-01-09 DIAGNOSIS — D2372 Other benign neoplasm of skin of left lower limb, including hip: Secondary | ICD-10-CM | POA: Diagnosis not present

## 2024-01-09 DIAGNOSIS — D225 Melanocytic nevi of trunk: Secondary | ICD-10-CM | POA: Diagnosis not present

## 2024-01-09 DIAGNOSIS — L821 Other seborrheic keratosis: Secondary | ICD-10-CM | POA: Diagnosis not present

## 2024-03-08 DIAGNOSIS — E785 Hyperlipidemia, unspecified: Secondary | ICD-10-CM | POA: Diagnosis not present

## 2024-03-08 DIAGNOSIS — E890 Postprocedural hypothyroidism: Secondary | ICD-10-CM | POA: Diagnosis not present

## 2024-03-08 DIAGNOSIS — E559 Vitamin D deficiency, unspecified: Secondary | ICD-10-CM | POA: Diagnosis not present

## 2024-03-08 DIAGNOSIS — R739 Hyperglycemia, unspecified: Secondary | ICD-10-CM | POA: Diagnosis not present

## 2024-03-14 DIAGNOSIS — M8588 Other specified disorders of bone density and structure, other site: Secondary | ICD-10-CM | POA: Diagnosis not present

## 2024-03-14 DIAGNOSIS — E785 Hyperlipidemia, unspecified: Secondary | ICD-10-CM | POA: Diagnosis not present

## 2024-03-14 DIAGNOSIS — Z1331 Encounter for screening for depression: Secondary | ICD-10-CM | POA: Diagnosis not present

## 2024-03-14 DIAGNOSIS — R739 Hyperglycemia, unspecified: Secondary | ICD-10-CM | POA: Diagnosis not present

## 2024-03-14 DIAGNOSIS — Z23 Encounter for immunization: Secondary | ICD-10-CM | POA: Diagnosis not present

## 2024-03-14 DIAGNOSIS — Z79890 Hormone replacement therapy (postmenopausal): Secondary | ICD-10-CM | POA: Diagnosis not present

## 2024-03-14 DIAGNOSIS — Z860100 Personal history of colon polyps, unspecified: Secondary | ICD-10-CM | POA: Diagnosis not present

## 2024-03-14 DIAGNOSIS — K5909 Other constipation: Secondary | ICD-10-CM | POA: Diagnosis not present

## 2024-03-14 DIAGNOSIS — Z1339 Encounter for screening examination for other mental health and behavioral disorders: Secondary | ICD-10-CM | POA: Diagnosis not present

## 2024-03-14 DIAGNOSIS — Z0000 Encounter for general adult medical examination without abnormal findings: Secondary | ICD-10-CM | POA: Diagnosis not present

## 2024-03-14 DIAGNOSIS — E890 Postprocedural hypothyroidism: Secondary | ICD-10-CM | POA: Diagnosis not present

## 2024-03-14 DIAGNOSIS — D1802 Hemangioma of intracranial structures: Secondary | ICD-10-CM | POA: Diagnosis not present

## 2024-03-16 ENCOUNTER — Other Ambulatory Visit: Payer: Self-pay | Admitting: Internal Medicine

## 2024-03-16 DIAGNOSIS — D1802 Hemangioma of intracranial structures: Secondary | ICD-10-CM

## 2024-04-05 DIAGNOSIS — N950 Postmenopausal bleeding: Secondary | ICD-10-CM | POA: Diagnosis not present

## 2024-04-05 DIAGNOSIS — N898 Other specified noninflammatory disorders of vagina: Secondary | ICD-10-CM | POA: Diagnosis not present

## 2024-04-05 DIAGNOSIS — L570 Actinic keratosis: Secondary | ICD-10-CM | POA: Diagnosis not present

## 2024-04-23 ENCOUNTER — Ambulatory Visit
Admission: RE | Admit: 2024-04-23 | Discharge: 2024-04-23 | Disposition: A | Discharge status: Home / Self Care | Source: Ambulatory Visit | Attending: Internal Medicine | Admitting: Internal Medicine

## 2024-04-23 ENCOUNTER — Ambulatory Visit
Admission: RE | Admit: 2024-04-23 | Discharge: 2024-04-23 | Disposition: A | Discharge status: Home / Self Care | Source: Ambulatory Visit | Attending: Internal Medicine

## 2024-04-23 DIAGNOSIS — D1802 Hemangioma of intracranial structures: Secondary | ICD-10-CM

## 2024-04-23 MED ORDER — GADOPICLENOL 0.5 MMOL/ML IV SOLN
6.5000 mL | Freq: Once | INTRAVENOUS | Status: AC | PRN
  Administered 2024-04-23: 6.5 mL via INTRAVENOUS

## 2024-04-25 DIAGNOSIS — N939 Abnormal uterine and vaginal bleeding, unspecified: Secondary | ICD-10-CM | POA: Diagnosis not present

## 2024-04-25 DIAGNOSIS — N950 Postmenopausal bleeding: Secondary | ICD-10-CM | POA: Diagnosis not present

## 2024-05-17 DIAGNOSIS — R030 Elevated blood-pressure reading, without diagnosis of hypertension: Secondary | ICD-10-CM | POA: Diagnosis not present

## 2024-05-21 DIAGNOSIS — R030 Elevated blood-pressure reading, without diagnosis of hypertension: Secondary | ICD-10-CM | POA: Diagnosis not present

## 2024-07-05 DIAGNOSIS — K581 Irritable bowel syndrome with constipation: Secondary | ICD-10-CM | POA: Diagnosis not present

## 2024-07-05 DIAGNOSIS — K5909 Other constipation: Secondary | ICD-10-CM | POA: Diagnosis not present

## 2024-07-05 DIAGNOSIS — K638219 Small intestinal bacterial overgrowth, unspecified: Secondary | ICD-10-CM | POA: Diagnosis not present

## 2024-07-05 DIAGNOSIS — Z800 Family history of malignant neoplasm of digestive organs: Secondary | ICD-10-CM | POA: Diagnosis not present

## 2024-07-06 DIAGNOSIS — L821 Other seborrheic keratosis: Secondary | ICD-10-CM | POA: Diagnosis not present

## 2024-07-06 DIAGNOSIS — L570 Actinic keratosis: Secondary | ICD-10-CM | POA: Diagnosis not present
